# Patient Record
Sex: Male | Born: 1975 | Race: Black or African American | Hispanic: No | Marital: Single | Smoking: Current every day smoker
Health system: Southern US, Community
[De-identification: ages and names within clinical notes are randomized; demographics above are authoritative.]

## PROBLEM LIST (undated history)

## (undated) DIAGNOSIS — I2489 Other forms of acute ischemic heart disease: Secondary | ICD-10-CM

## (undated) DIAGNOSIS — I10 Essential (primary) hypertension: Secondary | ICD-10-CM

## (undated) DIAGNOSIS — N1831 Chronic kidney disease, stage 3a: Secondary | ICD-10-CM

## (undated) DIAGNOSIS — I517 Cardiomegaly: Secondary | ICD-10-CM

## (undated) DIAGNOSIS — I248 Other forms of acute ischemic heart disease: Secondary | ICD-10-CM

## (undated) DIAGNOSIS — N1832 Chronic kidney disease, stage 3b: Secondary | ICD-10-CM

## (undated) DIAGNOSIS — F1410 Cocaine abuse, uncomplicated: Secondary | ICD-10-CM

## (undated) DIAGNOSIS — R911 Solitary pulmonary nodule: Secondary | ICD-10-CM

## (undated) HISTORY — DX: Other forms of acute ischemic heart disease: I24.89

## (undated) HISTORY — DX: Cardiomegaly: I51.7

## (undated) HISTORY — DX: Other forms of acute ischemic heart disease: I24.8

## (undated) HISTORY — DX: Cocaine abuse, uncomplicated: F14.10

## (undated) HISTORY — DX: Chronic kidney disease, stage 3a: N18.31

## (undated) HISTORY — DX: Essential (primary) hypertension: I10

## (undated) HISTORY — PX: OTHER SURGICAL HISTORY: SHX169

## (undated) HISTORY — DX: Chronic kidney disease, stage 3b: N18.32

## (undated) HISTORY — DX: Solitary pulmonary nodule: R91.1

---

## 1998-06-17 ENCOUNTER — Emergency Department (HOSPITAL_COMMUNITY): Admission: EM | Admit: 1998-06-17 | Discharge: 1998-06-17 | Payer: Self-pay

## 1999-07-19 ENCOUNTER — Encounter: Payer: Self-pay | Admitting: Emergency Medicine

## 1999-07-19 ENCOUNTER — Emergency Department (HOSPITAL_COMMUNITY): Admission: EM | Admit: 1999-07-19 | Discharge: 1999-07-19 | Payer: Self-pay | Admitting: Emergency Medicine

## 2000-01-28 ENCOUNTER — Encounter: Admission: RE | Admit: 2000-01-28 | Discharge: 2000-01-28 | Payer: Self-pay | Admitting: Sports Medicine

## 2000-02-08 ENCOUNTER — Emergency Department (HOSPITAL_COMMUNITY): Admission: EM | Admit: 2000-02-08 | Discharge: 2000-02-08 | Payer: Self-pay

## 2001-05-24 ENCOUNTER — Emergency Department (HOSPITAL_COMMUNITY): Admission: EM | Admit: 2001-05-24 | Discharge: 2001-05-24 | Payer: Self-pay | Admitting: Emergency Medicine

## 2001-07-26 ENCOUNTER — Emergency Department (HOSPITAL_COMMUNITY): Admission: EM | Admit: 2001-07-26 | Discharge: 2001-07-26 | Payer: Self-pay | Admitting: Emergency Medicine

## 2002-06-11 ENCOUNTER — Emergency Department (HOSPITAL_COMMUNITY): Admission: EM | Admit: 2002-06-11 | Discharge: 2002-06-11 | Payer: Self-pay | Admitting: Emergency Medicine

## 2003-06-27 ENCOUNTER — Emergency Department (HOSPITAL_COMMUNITY): Admission: EM | Admit: 2003-06-27 | Discharge: 2003-06-27 | Payer: Self-pay | Admitting: Emergency Medicine

## 2003-06-27 ENCOUNTER — Encounter: Payer: Self-pay | Admitting: Emergency Medicine

## 2003-07-23 ENCOUNTER — Encounter: Payer: Self-pay | Admitting: *Deleted

## 2003-07-23 ENCOUNTER — Emergency Department (HOSPITAL_COMMUNITY): Admission: EM | Admit: 2003-07-23 | Discharge: 2003-07-23 | Payer: Self-pay | Admitting: Emergency Medicine

## 2004-03-21 ENCOUNTER — Emergency Department (HOSPITAL_COMMUNITY): Admission: AC | Admit: 2004-03-21 | Discharge: 2004-03-21 | Payer: Self-pay

## 2005-07-08 ENCOUNTER — Emergency Department (HOSPITAL_COMMUNITY): Admission: EM | Admit: 2005-07-08 | Discharge: 2005-07-09 | Payer: Self-pay | Admitting: Family Medicine

## 2008-07-20 ENCOUNTER — Emergency Department (HOSPITAL_COMMUNITY): Admission: EM | Admit: 2008-07-20 | Discharge: 2008-07-21 | Payer: Self-pay | Admitting: Emergency Medicine

## 2008-07-26 ENCOUNTER — Encounter: Admission: RE | Admit: 2008-07-26 | Discharge: 2008-07-26 | Payer: Self-pay | Admitting: Chiropractic Medicine

## 2008-10-30 ENCOUNTER — Inpatient Hospital Stay (HOSPITAL_COMMUNITY): Admission: EM | Admit: 2008-10-30 | Discharge: 2008-10-31 | Payer: Self-pay | Admitting: Emergency Medicine

## 2010-07-12 ENCOUNTER — Emergency Department (HOSPITAL_COMMUNITY): Admission: EM | Admit: 2010-07-12 | Discharge: 2010-07-12 | Payer: Self-pay | Admitting: Emergency Medicine

## 2010-07-19 ENCOUNTER — Emergency Department (HOSPITAL_COMMUNITY): Admission: EM | Admit: 2010-07-19 | Discharge: 2010-07-19 | Payer: Self-pay | Admitting: Emergency Medicine

## 2010-08-08 ENCOUNTER — Inpatient Hospital Stay (HOSPITAL_COMMUNITY)
Admission: EM | Admit: 2010-08-08 | Discharge: 2010-08-18 | Payer: Self-pay | Source: Home / Self Care | Admitting: Emergency Medicine

## 2010-08-09 ENCOUNTER — Ambulatory Visit: Payer: Self-pay | Admitting: Internal Medicine

## 2011-02-13 LAB — RENAL FUNCTION PANEL
Albumin: 2.6 g/dL — ABNORMAL LOW (ref 3.5–5.2)
Albumin: 2.8 g/dL — ABNORMAL LOW (ref 3.5–5.2)
Albumin: 2.8 g/dL — ABNORMAL LOW (ref 3.5–5.2)
Albumin: 2.9 g/dL — ABNORMAL LOW (ref 3.5–5.2)
BUN: 32 mg/dL — ABNORMAL HIGH (ref 6–23)
BUN: 42 mg/dL — ABNORMAL HIGH (ref 6–23)
BUN: 43 mg/dL — ABNORMAL HIGH (ref 6–23)
CO2: 23 mEq/L (ref 19–32)
CO2: 24 mEq/L (ref 19–32)
CO2: 33 mEq/L — ABNORMAL HIGH (ref 19–32)
CO2: 33 mEq/L — ABNORMAL HIGH (ref 19–32)
Calcium: 8.6 mg/dL (ref 8.4–10.5)
Calcium: 9.2 mg/dL (ref 8.4–10.5)
Calcium: 9.6 mg/dL (ref 8.4–10.5)
Calcium: 7.1 mg/dL — ABNORMAL LOW (ref 8.4–10.5)
Calcium: 8.4 mg/dL (ref 8.4–10.5)
Chloride: 105 mEq/L (ref 96–112)
Chloride: 95 mEq/L — ABNORMAL LOW (ref 96–112)
Chloride: 97 mEq/L (ref 96–112)
Chloride: 98 mEq/L (ref 96–112)
Chloride: 98 mEq/L (ref 96–112)
Creatinine, Ser: 2.94 mg/dL — ABNORMAL HIGH (ref 0.4–1.5)
Creatinine, Ser: 3.86 mg/dL — ABNORMAL HIGH (ref 0.4–1.5)
Creatinine, Ser: 7.95 mg/dL — ABNORMAL HIGH (ref 0.4–1.5)
GFR calc Af Amer: 22 mL/min — ABNORMAL LOW (ref >60)
GFR calc Af Amer: 9 mL/min — ABNORMAL LOW (ref >60)
GFR calc Af Amer: 9 mL/min — ABNORMAL LOW (ref >60)
GFR calc non Af Amer: 18 mL/min — ABNORMAL LOW (ref >60)
GFR calc non Af Amer: 8 mL/min — ABNORMAL LOW (ref >60)
GFR calc non Af Amer: 8 mL/min — ABNORMAL LOW (ref >60)
Glucose, Bld: 102 mg/dL — ABNORMAL HIGH (ref 70–99)
Glucose, Bld: 83 mg/dL (ref 70–99)
Glucose, Bld: 107 mg/dL — ABNORMAL HIGH (ref 70–99)
Glucose, Bld: 118 mg/dL — ABNORMAL HIGH (ref 70–99)
Glucose, Bld: 88 mg/dL (ref 70–99)
Glucose, Bld: 91 mg/dL (ref 70–99)
Phosphorus: 4 mg/dL (ref 2.3–4.6)
Phosphorus: 6.3 mg/dL — ABNORMAL HIGH (ref 2.3–4.6)
Phosphorus: 6.2 mg/dL — ABNORMAL HIGH (ref 2.3–4.6)
Phosphorus: 6.2 mg/dL — ABNORMAL HIGH (ref 2.3–4.6)
Phosphorus: 6.5 mg/dL — ABNORMAL HIGH (ref 2.3–4.6)
Potassium: 2.8 mEq/L — ABNORMAL LOW (ref 3.5–5.1)
Potassium: 2.8 mEq/L — ABNORMAL LOW (ref 3.5–5.1)
Potassium: 3.1 mEq/L — ABNORMAL LOW (ref 3.5–5.1)
Potassium: 3.4 mEq/L — ABNORMAL LOW (ref 3.5–5.1)
Potassium: 3.6 mEq/L (ref 3.5–5.1)
Potassium: 3.7 mEq/L (ref 3.5–5.1)
Sodium: 136 mEq/L (ref 135–145)
Sodium: 141 mEq/L (ref 135–145)
Sodium: 142 mEq/L (ref 135–145)

## 2011-02-13 LAB — TRICYCLIC ANTIDEPRESSANT EVALUATION
Amitriptyline: <5 mcg/L
Desemethylclomipramine: <5 ng/mL
Desmethyldoxepin: <5 mcg/L
Imipramine IPRAM: <5 mcg/L
Nortriptyline NTRIP: <5 mcg/L
Tot Clomipramine+Desmethylclomipramine: NOT DETECTED not reported
Total Desmethyldoxepine+Doxepin: <5 mcg/L — ABNORMAL LOW (ref 100–250)

## 2011-02-13 LAB — CBC
HCT: 38.7 % — ABNORMAL LOW (ref 39.0–52.0)
HCT: 36.5 % — ABNORMAL LOW (ref 39.0–52.0)
HCT: 47.2 % (ref 39.0–52.0)
Hemoglobin: 13.2 g/dL (ref 13.0–17.0)
Hemoglobin: 12.5 g/dL — ABNORMAL LOW (ref 13.0–17.0)
Hemoglobin: 13 g/dL (ref 13.0–17.0)
Hemoglobin: 17 g/dL (ref 13.0–17.0)
MCH: 30 pg (ref 26.0–34.0)
MCH: 31.4 pg (ref 26.0–34.0)
MCHC: 34.1 g/dL (ref 30.0–36.0)
MCHC: 34.9 g/dL (ref 30.0–36.0)
MCHC: 36 g/dL (ref 30.0–36.0)
MCV: 88 fL (ref 78.0–100.0)
MCV: 88.2 fL (ref 78.0–100.0)
RBC: 5.41 MIL/uL (ref 4.22–5.81)
RDW: 13.7 % (ref 11.5–15.5)
RDW: 13.8 % (ref 11.5–15.5)
WBC: 5.7 10*3/uL (ref 4.0–10.5)

## 2011-02-13 LAB — URINALYSIS, ROUTINE W REFLEX MICROSCOPIC
Hgb urine dipstick: NEGATIVE
Ketones, ur: 15 mg/dL — AB
Specific Gravity, Urine: 1.035 — ABNORMAL HIGH (ref 1.005–1.030)
pH: 5.5 (ref 5.0–8.0)

## 2011-02-13 LAB — COMPREHENSIVE METABOLIC PANEL
ALT: 118 U/L — ABNORMAL HIGH (ref 0–53)
AST: 487 U/L — ABNORMAL HIGH (ref 0–37)
AST: 912 U/L — ABNORMAL HIGH (ref 0–37)
Albumin: 2.8 g/dL — ABNORMAL LOW (ref 3.5–5.2)
Albumin: 3.7 g/dL (ref 3.5–5.2)
Alkaline Phosphatase: 55 U/L (ref 39–117)
Alkaline Phosphatase: 66 U/L (ref 39–117)
BUN: 33 mg/dL — ABNORMAL HIGH (ref 6–23)
Calcium: 9.2 mg/dL (ref 8.4–10.5)
Calcium: 6.9 mg/dL — ABNORMAL LOW (ref 8.4–10.5)
Chloride: 105 mEq/L (ref 96–112)
Creatinine, Ser: 4.81 mg/dL — ABNORMAL HIGH (ref 0.4–1.5)
Creatinine, Ser: 4.16 mg/dL — ABNORMAL HIGH (ref 0.4–1.5)
Creatinine, Ser: 6.37 mg/dL — ABNORMAL HIGH (ref 0.4–1.5)
GFR calc Af Amer: 17 mL/min — ABNORMAL LOW (ref >60)
GFR calc Af Amer: 12 mL/min — ABNORMAL LOW (ref >60)
GFR calc Af Amer: 20 mL/min — ABNORMAL LOW (ref >60)
Glucose, Bld: 100 mg/dL — ABNORMAL HIGH (ref 70–99)
Potassium: 3.5 mEq/L (ref 3.5–5.1)
Sodium: 143 mEq/L (ref 135–145)
Total Bilirubin: 1 mg/dL (ref 0.3–1.2)
Total Protein: 7.1 g/dL (ref 6.0–8.3)
Total Protein: 5.4 g/dL — ABNORMAL LOW (ref 6.0–8.3)

## 2011-02-13 LAB — HEPATITIS PANEL, ACUTE
Hep A IgM: NEGATIVE
Hep B C IgM: NEGATIVE
Hepatitis B Surface Ag: NEGATIVE

## 2011-02-13 LAB — BASIC METABOLIC PANEL
CO2: 18 mEq/L — ABNORMAL LOW (ref 19–32)
CO2: 31 mEq/L (ref 19–32)
Calcium: 10 mg/dL (ref 8.4–10.5)
Chloride: 107 mEq/L (ref 96–112)
Chloride: 94 mEq/L — ABNORMAL LOW (ref 96–112)
GFR calc Af Amer: 27 mL/min — ABNORMAL LOW (ref >60)
GFR calc Af Amer: 9 mL/min — ABNORMAL LOW (ref >60)
Glucose, Bld: 159 mg/dL — ABNORMAL HIGH (ref 70–99)
Potassium: 3.3 mEq/L — ABNORMAL LOW (ref 3.5–5.1)
Potassium: 4.9 mEq/L (ref 3.5–5.1)
Sodium: 136 mEq/L (ref 135–145)
Sodium: 137 mEq/L (ref 135–145)

## 2011-02-13 LAB — CK
Total CK: 2269 U/L — ABNORMAL HIGH (ref 7–232)
Total CK: 19650 U/L — ABNORMAL HIGH (ref 7–232)
Total CK: 38858 U/L — ABNORMAL HIGH (ref 7–232)
Total CK: 4817 U/L — ABNORMAL HIGH (ref 7–232)
Total CK: 48486 U/L — ABNORMAL HIGH (ref 7–232)

## 2011-02-13 LAB — RAPID URINE DRUG SCREEN, HOSP PERFORMED
Amphetamines: NOT DETECTED
Benzodiazepines: NOT DETECTED
Cocaine: POSITIVE — AB
Opiates: NOT DETECTED
Tetrahydrocannabinol: NOT DETECTED

## 2011-02-13 LAB — HIV ANTIBODY (ROUTINE TESTING W REFLEX): HIV: NONREACTIVE

## 2011-02-13 LAB — URINE CULTURE

## 2011-02-13 LAB — HEPATIC FUNCTION PANEL
ALT: 35 U/L (ref 0–53)
AST: 85 U/L — ABNORMAL HIGH (ref 0–37)
Alkaline Phosphatase: 79 U/L (ref 39–117)
Bilirubin, Direct: <0.1 mg/dL (ref 0.0–0.3)

## 2011-02-13 LAB — URINE MICROSCOPIC-ADD ON

## 2011-02-13 LAB — CARDIAC PANEL(CRET KIN+CKTOT+MB+TROPI)
CK, MB: 5.4 ng/mL — ABNORMAL HIGH (ref 0.3–4.0)
CK, MB: 278.9 ng/mL (ref 0.3–4.0)
Relative Index: 0.6 (ref 0.0–2.5)
Total CK: 339 U/L — ABNORMAL HIGH (ref 7–232)
Total CK: 44718 U/L — ABNORMAL HIGH (ref 7–232)
Troponin I: 0.09 ng/mL — ABNORMAL HIGH (ref 0.00–0.06)
Troponin I: 2.24 ng/mL (ref 0.00–0.06)

## 2011-02-13 LAB — PHOSPHORUS: Phosphorus: 5.2 mg/dL — ABNORMAL HIGH (ref 2.3–4.6)

## 2011-02-13 LAB — CREATININE, URINE, RANDOM: Creatinine, Urine: 266.4 mg/dL

## 2011-02-13 LAB — CK TOTAL AND CKMB (NOT AT ARMC): Total CK: 16414 U/L — ABNORMAL HIGH (ref 7–232)

## 2011-02-13 LAB — PROTIME-INR: Prothrombin Time: 18 seconds — ABNORMAL HIGH (ref 11.6–15.2)
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
## 2011-09-02 LAB — ETHANOL: Alcohol, Ethyl (B): 107 mg/dL — ABNORMAL HIGH (ref 0–10)

## 2011-09-02 LAB — POCT I-STAT, CHEM 8
Creatinine, Ser: 1.7 mg/dL — ABNORMAL HIGH (ref 0.4–1.5)
HCT: 41 % (ref 39.0–52.0)
Hemoglobin: 13.9 g/dL (ref 13.0–17.0)
Potassium: 4 mEq/L (ref 3.5–5.1)
Sodium: 140 mEq/L (ref 135–145)
TCO2: 21 mmol/L (ref 0–100)

## 2011-09-02 LAB — URINALYSIS, ROUTINE W REFLEX MICROSCOPIC
Bilirubin Urine: NEGATIVE
Ketones, ur: NEGATIVE mg/dL
Nitrite: NEGATIVE
Protein, ur: NEGATIVE mg/dL
Urobilinogen, UA: 0.2 mg/dL (ref 0.0–1.0)

## 2011-09-02 LAB — DIFFERENTIAL
Basophils Absolute: 0 10*3/uL (ref 0.0–0.1)
Basophils Absolute: 0 10*3/uL (ref 0.0–0.1)
Basophils Relative: 1 % (ref 0–1)
Basophils Relative: 1 % (ref 0–1)
Eosinophils Absolute: 0.2 10*3/uL (ref 0.0–0.7)
Eosinophils Absolute: 0.2 10*3/uL (ref 0.0–0.7)
Eosinophils Relative: 3 % (ref 0–5)
Eosinophils Relative: 3 % (ref 0–5)
Lymphocytes Relative: 36 % (ref 12–46)
Monocytes Absolute: 0.4 10*3/uL (ref 0.1–1.0)
Monocytes Absolute: 0.7 10*3/uL (ref 0.1–1.0)
Neutro Abs: 3.8 10*3/uL (ref 1.7–7.7)

## 2011-09-02 LAB — CBC
HCT: 40.4 % (ref 39.0–52.0)
Hemoglobin: 13.3 g/dL (ref 13.0–17.0)
Hemoglobin: 13.8 g/dL (ref 13.0–17.0)
MCHC: 34.2 g/dL (ref 30.0–36.0)
MCHC: 34.2 g/dL (ref 30.0–36.0)
MCV: 91.1 fL (ref 78.0–100.0)
Platelets: 181 10*3/uL (ref 150–400)
RDW: 13.5 % (ref 11.5–15.5)
RDW: 13.7 % (ref 11.5–15.5)

## 2011-09-02 LAB — BASIC METABOLIC PANEL
CO2: 25 mEq/L (ref 19–32)
Calcium: 8.5 mg/dL (ref 8.4–10.5)
Chloride: 112 mEq/L (ref 96–112)
Glucose, Bld: 73 mg/dL (ref 70–99)
Sodium: 141 mEq/L (ref 135–145)

## 2011-09-02 LAB — TRICYCLICS SCREEN, URINE: TCA Scrn: NOT DETECTED

## 2011-09-02 LAB — SAMPLE TO BLOOD BANK

## 2011-09-02 LAB — RAPID URINE DRUG SCREEN, HOSP PERFORMED
Cocaine: NOT DETECTED
Tetrahydrocannabinol: NOT DETECTED

## 2011-09-05 LAB — CBC
MCHC: 34.8 g/dL (ref 30.0–36.0)
Platelets: 185 10*3/uL (ref 150–400)
RDW: 13.1 % (ref 11.5–15.5)

## 2018-11-15 ENCOUNTER — Ambulatory Visit: Payer: Self-pay | Admitting: Family Medicine

## 2018-12-15 ENCOUNTER — Encounter: Payer: Self-pay | Admitting: Family Medicine

## 2018-12-15 ENCOUNTER — Ambulatory Visit: Payer: Self-pay | Admitting: Family Medicine

## 2018-12-15 ENCOUNTER — Ambulatory Visit: Payer: Self-pay | Attending: Family Medicine | Admitting: Family Medicine

## 2018-12-15 DIAGNOSIS — I10 Essential (primary) hypertension: Secondary | ICD-10-CM | POA: Insufficient documentation

## 2018-12-15 MED ORDER — HYDRALAZINE HCL 25 MG PO TABS: 25.0000 mg | ORAL_TABLET | Freq: Two times a day (BID) | ORAL | 6 refills | Status: DC

## 2018-12-15 MED ORDER — HYDROCHLOROTHIAZIDE 25 MG PO TABS: 25.0000 mg | ORAL_TABLET | Freq: Every day | ORAL | 6 refills | Status: DC

## 2018-12-15 MED ORDER — AMLODIPINE BESYLATE 10 MG PO TABS: 10.0000 mg | ORAL_TABLET | Freq: Every day | ORAL | 6 refills | Status: DC

## 2018-12-15 MED FILL — hydrALAZINE HCL 25 MG TABS: 25 | 30 days supply | Qty: 60 | Fill #0

## 2018-12-15 MED FILL — HYDROCHLOROTHIAZIDE 25 MG T: 25 | 30 days supply | Qty: 30 | Fill #0

## 2018-12-15 MED FILL — AMLODIPINE BESYLATE 10 MG T: 10 | 30 days supply | Qty: 30 | Fill #0

## 2018-12-15 NOTE — Progress Notes (Signed)
Subjective:  Patient ID: Irving Shows, male    DOB: November 26, 1976  Age: 43 y.o. MRN: [id number]  CC: New Patient (Initial Visit)   HPI ARJEN DERINGER is a 43 year old male who presents today to get established with me; prior to now he was followed by the physician where he was incarcerated. He has a medical history significant for hypertension and has been on hydralazine, amlodipine, hydrochlorothiazide which he tolerates well with no adverse effects. He is noncompliant with a diabetic diet or exercise but plans to work on this. He also takes ginger and other herbal supplements because he would like to wean himself off his antihypertensives if possible.  Past Medical History:  Diagnosis Date  . Hypertension     Past Surgical History:  Procedure Laterality Date  . no past surgery      Not on File  Family History  Problem Relation Age of Onset  . Hypertension Mother   . Hypertension Father     No outpatient medications prior to visit.   No facility-administered medications prior to visit.     ROS Review of Systems  Constitutional: Negative for activity change and appetite change.  HENT: Negative for sinus pressure and sore throat.   Eyes: Negative for visual disturbance.  Respiratory: Negative for cough, chest tightness and shortness of breath.   Cardiovascular: Negative for chest pain and leg swelling.  Gastrointestinal: Negative for abdominal distention, abdominal pain, constipation and diarrhea.  Endocrine: Negative.   Genitourinary: Negative for dysuria.  Musculoskeletal: Negative for joint swelling and myalgias.  Skin: Negative for rash.  Allergic/Immunologic: Negative.   Neurological: Negative for weakness, light-headedness and numbness.  Psychiatric/Behavioral: Negative for dysphoric mood and suicidal ideas.    Objective:  BP (!) 155/85   Pulse 76   Temp 98.1 F (36.7 C) (Oral)   Ht '5\' 4"'$  (1.626 m)   Wt 224 lb (101.6 kg)   SpO2 97%   BMI 38.45  kg/m   BP/Weight 03/09/8118  Systolic BP 147  Diastolic BP 85  Wt. (Lbs) 224  BMI 38.45     Physical Exam Constitutional:      Appearance: He is well-developed.  Cardiovascular:     Rate and Rhythm: Normal rate.     Heart sounds: Normal heart sounds. No murmur.  Pulmonary:     Effort: Pulmonary effort is normal.     Breath sounds: Normal breath sounds. No wheezing or rales.  Chest:     Chest wall: No tenderness.  Abdominal:     General: Bowel sounds are normal. There is no distension.     Palpations: Abdomen is soft. There is no mass.     Tenderness: There is no abdominal tenderness.  Musculoskeletal: Normal range of motion.  Neurological:     Mental Status: He is alert and oriented to person, place, and time.      Assessment & Plan:   1. Essential hypertension Uncontrolled No regimen change today Lifestyle modification including weight loss and will reassess at next visit  Discussed with the patient that at this time the probability of him coming off his antihypertensive is very slim but we could work towards lifestyle modifications and reassess at his next visit. - amLODipine (NORVASC) 10 MG tablet; Take 1 tablet (10 mg total) by mouth daily.  Dispense: 30 tablet; Refill: 6 - hydrochlorothiazide (HYDRODIURIL) 25 MG tablet; Take 1 tablet (25 mg total) by mouth daily.  Dispense: 30 tablet; Refill: 6 - hydrALAZINE (APRESOLINE) 25 MG tablet; Take  1 tablet (25 mg total) by mouth 2 (two) times daily.  Dispense: 60 tablet; Refill: 6 - CMP14+EGFR; Future - Lipid panel; Future   Meds ordered this encounter  Medications  . amLODipine (NORVASC) 10 MG tablet    Sig: Take 1 tablet (10 mg total) by mouth daily.    Dispense:  30 tablet    Refill:  6  . hydrochlorothiazide (HYDRODIURIL) 25 MG tablet    Sig: Take 1 tablet (25 mg total) by mouth daily.    Dispense:  30 tablet    Refill:  6  . hydrALAZINE (APRESOLINE) 25 MG tablet    Sig: Take 1 tablet (25 mg total) by mouth 2  (two) times daily.    Dispense:  60 tablet    Refill:  6    Follow-up: Return in about 3 months (around 03/16/2019) for Follow-up of chronic medical conditions.   Charlott Rakes MD

## 2018-12-22 ENCOUNTER — Other Ambulatory Visit: Payer: Self-pay

## 2018-12-29 ENCOUNTER — Telehealth: Payer: Self-pay | Admitting: Family Medicine

## 2018-12-29 ENCOUNTER — Other Ambulatory Visit: Payer: Self-pay

## 2018-12-29 NOTE — Telephone Encounter (Signed)
Pt called to request a Letter be written to social services stating that he is a patient of this facility. Please follow up

## 2018-12-29 NOTE — Telephone Encounter (Signed)
Forwarding to Dr. Alvis Lemmings who recently saw this patient

## 2018-12-31 ENCOUNTER — Other Ambulatory Visit: Payer: Self-pay

## 2018-12-31 NOTE — Telephone Encounter (Signed)
Letter is ready for pick-up.

## 2018-12-31 NOTE — Telephone Encounter (Signed)
Patient was called and informed of letter being ready for pick up. Patient's letter will be up front in log box.

## 2019-07-27 ENCOUNTER — Emergency Department (HOSPITAL_BASED_OUTPATIENT_CLINIC_OR_DEPARTMENT_OTHER)
Admission: EM | Admit: 2019-07-27 | Discharge: 2019-07-27 | Disposition: A | Discharge status: Home / Self Care | Payer: Self-pay | Attending: Emergency Medicine | Admitting: Emergency Medicine

## 2019-07-27 ENCOUNTER — Other Ambulatory Visit: Payer: Self-pay

## 2019-07-27 ENCOUNTER — Encounter (HOSPITAL_BASED_OUTPATIENT_CLINIC_OR_DEPARTMENT_OTHER): Payer: Self-pay

## 2019-07-27 DIAGNOSIS — I10 Essential (primary) hypertension: Secondary | ICD-10-CM | POA: Insufficient documentation

## 2019-07-27 DIAGNOSIS — Z91013 Allergy to seafood: Secondary | ICD-10-CM | POA: Insufficient documentation

## 2019-07-27 DIAGNOSIS — Z79899 Other long term (current) drug therapy: Secondary | ICD-10-CM | POA: Insufficient documentation

## 2019-07-27 DIAGNOSIS — Z885 Allergy status to narcotic agent status: Secondary | ICD-10-CM | POA: Insufficient documentation

## 2019-07-27 DIAGNOSIS — J020 Streptococcal pharyngitis: Secondary | ICD-10-CM

## 2019-07-27 DIAGNOSIS — J029 Acute pharyngitis, unspecified: Secondary | ICD-10-CM | POA: Insufficient documentation

## 2019-07-27 DIAGNOSIS — F17210 Nicotine dependence, cigarettes, uncomplicated: Secondary | ICD-10-CM | POA: Insufficient documentation

## 2019-07-27 LAB — GROUP A STREP BY PCR: Group A Strep by PCR: NOT DETECTED

## 2019-07-27 MED ORDER — AMOXICILLIN 500 MG PO CAPS: 1000.0000 mg | ORAL_CAPSULE | Freq: Two times a day (BID) | ORAL | 0 refills | Status: DC

## 2019-07-27 MED ORDER — AMOXICILLIN 500 MG PO CAPS
1000.0000 mg | ORAL_CAPSULE | Freq: Once | ORAL | Status: AC
  Administered 2019-07-27: 1000 mg via ORAL
  Filled 2019-07-27: qty 2

## 2019-07-27 NOTE — ED Triage Notes (Signed)
Pt c/o sore throat x today-NAD-steady gait

## 2019-07-27 NOTE — Discharge Instructions (Signed)
Take tylenol 2 pills 4 times a day and motrin 4 pills 3 times a day.  Drink plenty of fluids.  Return for worsening shortness of breath, headache, confusion. Follow up with your family doctor.   

## 2019-07-27 NOTE — ED Provider Notes (Signed)
[facility] EMERGENCY DEPARTMENT Provider Note   CSN: [id number] Arrival date & time: 07/27/19  2114     History   Chief Complaint Chief Complaint  Patient presents with  . Sore Throat    HPI Anthony Estrada is a 43 y.o. male.     43 yo M with a chief complaints of a sore throat.  Worse on the left than the right.  Patient has a history of strep throat thinks is the same.  His son also has had strep throat recently and he thinks maybe he got it from him.  No cough no fevers.  The history is provided by the patient.  Sore Throat This is a new problem. The current episode started yesterday. The problem occurs constantly. The problem has not changed since onset.Pertinent negatives include no chest pain, no abdominal pain, no headaches and no shortness of breath. The symptoms are aggravated by swallowing. Nothing relieves the symptoms. He has tried nothing for the symptoms. The treatment provided no relief.    Past Medical History:  Diagnosis Date  . Hypertension     Patient Active Problem List   Diagnosis Date Noted  . Hypertension 12/15/2018    Past Surgical History:  Procedure Laterality Date  . no past surgery          Home Medications    Prior to Admission medications   Medication Sig Start Date End Date Taking? Authorizing Provider  amLODipine (NORVASC) 10 MG tablet Take 1 tablet (10 mg total) by mouth daily. 12/15/18   Hoy RegisterNewlin, Enobong, MD  amoxicillin (AMOXIL) 500 MG capsule Take 2 capsules (1,000 mg total) by mouth 2 (two) times daily. 07/27/19   Melene PlanFloyd, Nachum Derossett, DO  hydrALAZINE (APRESOLINE) 25 MG tablet Take 1 tablet (25 mg total) by mouth 2 (two) times daily. 12/15/18   Hoy RegisterNewlin, Enobong, MD  hydrochlorothiazide (HYDRODIURIL) 25 MG tablet Take 1 tablet (25 mg total) by mouth daily. 12/15/18   Hoy RegisterNewlin, Enobong, MD    Family History Family History  Problem Relation Age of Onset  . Hypertension Mother   . Hypertension Father     Social History Social  History   Tobacco Use  . Smoking status: Current Every Day Smoker    Types: Cigarettes  . Smokeless tobacco: Never Used  Substance Use Topics  . Alcohol use: Never    Frequency: Never  . Drug use: Never     Allergies   Codeine and Shellfish allergy   Review of Systems Review of Systems  Constitutional: Negative for chills and fever.  HENT: Positive for sore throat. Negative for congestion and facial swelling.   Eyes: Negative for discharge and visual disturbance.  Respiratory: Negative for shortness of breath.   Cardiovascular: Negative for chest pain and palpitations.  Gastrointestinal: Negative for abdominal pain, diarrhea and vomiting.  Musculoskeletal: Negative for arthralgias and myalgias.  Skin: Negative for color change and rash.  Neurological: Negative for tremors, syncope and headaches.  Psychiatric/Behavioral: Negative for confusion and dysphoric mood.     Physical Exam Updated Vital Signs BP (!) 183/114 (BP Location: Left Arm)   Pulse 80   Temp 99.4 F (37.4 C) (Oral)   Resp 20   Ht 5\' 4"  (1.626 m)   Wt 110.2 kg   SpO2 99%   BMI 41.71 kg/m   Physical Exam Vitals signs and nursing note reviewed.  Constitutional:      Appearance: He is well-developed.  HENT:     Head: Normocephalic and atraumatic.  Comments: Tonsillar swelling and exudates worse on the left than the right.  Left anterior cervical lymphadenopathy.  Full range of motion of the neck tolerating secretions without difficulty.  Uvula is midline.    Mouth/Throat:     Tonsils: Tonsillar exudate present. 1+ on the right. 2+ on the left.  Eyes:     Pupils: Pupils are equal, round, and reactive to light.  Neck:     Musculoskeletal: Normal range of motion and neck supple.     Vascular: No JVD.  Cardiovascular:     Rate and Rhythm: Normal rate and regular rhythm.     Heart sounds: No murmur. No friction rub. No gallop.   Pulmonary:     Effort: No respiratory distress.     Breath sounds:  No wheezing.  Abdominal:     General: There is no distension.     Tenderness: There is no guarding or rebound.  Musculoskeletal: Normal range of motion.  Skin:    Coloration: Skin is not pale.     Findings: No rash.  Neurological:     Mental Status: He is alert and oriented to person, place, and time.  Psychiatric:        Behavior: Behavior normal.      ED Treatments / Results  Labs (all labs ordered are listed, but only abnormal results are displayed) Labs Reviewed  GROUP A STREP BY PCR    EKG None  Radiology No results found.  Procedures Procedures (including critical care time)  Medications Ordered in ED Medications  amoxicillin (AMOXIL) capsule 1,000 mg (1,000 mg Oral Given 07/27/19 2256)     Initial Impression / Assessment and Plan / ED Course  I have reviewed the triage vital signs and the nursing notes.  Pertinent labs & imaging results that were available during my care of the patient were reviewed by me and considered in my medical decision making (see chart for details).        43 yo M with a chief complaint of a sore throat.  Clinically the patient has strep pharyngitis.  Was recently exposed to strep as well.  Rapid strep test sent.  The strep test is negative.  Patient does have high score on centor criteria.  Will treat presumptively.  PCP follow-up.  11:33 PM:  I have discussed the diagnosis/risks/treatment options with the patient and family and believe the pt to be eligible for discharge home to follow-up with PCP. We also discussed returning to the ED immediately if new or worsening sx occur. We discussed the sx which are most concerning (e.g., sudden worsening pain, fever, inability to tolerate by mouth) that necessitate immediate return. Medications administered to the patient during their visit and any new prescriptions provided to the patient are listed below.  Medications given during this visit Medications  amoxicillin (AMOXIL) capsule 1,000  mg (1,000 mg Oral Given 07/27/19 2256)     The patient appears reasonably screen and/or stabilized for discharge and I doubt any other medical condition or other [facility] requiring further screening, evaluation, or treatment in the ED at this time prior to discharge.    Final Clinical Impressions(s) / ED Diagnoses   Final diagnoses:  Strep pharyngitis    ED Discharge Orders         Ordered    amoxicillin (AMOXIL) 500 MG capsule  2 times daily     07/27/19 2246           Deno Etienne, DO 07/27/19 2333

## 2019-07-28 MED FILL — AMOXICILLIN 500 MG CAPSULE: 500 | 10 days supply | Qty: 40 | Fill #0

## 2019-11-21 ENCOUNTER — Emergency Department (HOSPITAL_BASED_OUTPATIENT_CLINIC_OR_DEPARTMENT_OTHER): Payer: Self-pay

## 2019-11-21 ENCOUNTER — Observation Stay (HOSPITAL_BASED_OUTPATIENT_CLINIC_OR_DEPARTMENT_OTHER)
Admission: EM | Admit: 2019-11-21 | Discharge: 2019-11-21 | Discharge status: Against Medical Advice | Payer: Self-pay | Attending: Internal Medicine | Admitting: Internal Medicine

## 2019-11-21 ENCOUNTER — Other Ambulatory Visit: Payer: Self-pay

## 2019-11-21 ENCOUNTER — Encounter (HOSPITAL_BASED_OUTPATIENT_CLINIC_OR_DEPARTMENT_OTHER): Payer: Self-pay | Admitting: *Deleted

## 2019-11-21 DIAGNOSIS — R7989 Other specified abnormal findings of blood chemistry: Secondary | ICD-10-CM

## 2019-11-21 DIAGNOSIS — Z8249 Family history of ischemic heart disease and other diseases of the circulatory system: Secondary | ICD-10-CM | POA: Insufficient documentation

## 2019-11-21 DIAGNOSIS — F17210 Nicotine dependence, cigarettes, uncomplicated: Secondary | ICD-10-CM | POA: Insufficient documentation

## 2019-11-21 DIAGNOSIS — I214 Non-ST elevation (NSTEMI) myocardial infarction: Secondary | ICD-10-CM | POA: Diagnosis present

## 2019-11-21 DIAGNOSIS — Z91013 Allergy to seafood: Secondary | ICD-10-CM | POA: Insufficient documentation

## 2019-11-21 DIAGNOSIS — I10 Essential (primary) hypertension: Secondary | ICD-10-CM

## 2019-11-21 DIAGNOSIS — Z885 Allergy status to narcotic agent status: Secondary | ICD-10-CM | POA: Insufficient documentation

## 2019-11-21 DIAGNOSIS — R778 Other specified abnormalities of plasma proteins: Secondary | ICD-10-CM

## 2019-11-21 DIAGNOSIS — Z20828 Contact with and (suspected) exposure to other viral communicable diseases: Secondary | ICD-10-CM | POA: Insufficient documentation

## 2019-11-21 DIAGNOSIS — R079 Chest pain, unspecified: Principal | ICD-10-CM

## 2019-11-21 LAB — MAGNESIUM: Magnesium: 2.2 mg/dL (ref 1.7–2.4)

## 2019-11-21 LAB — TROPONIN I (HIGH SENSITIVITY)
Troponin I (High Sensitivity): 63 ng/L — ABNORMAL HIGH (ref <18)
Troponin I (High Sensitivity): 69 ng/L — ABNORMAL HIGH (ref <18)

## 2019-11-21 LAB — CBC
HCT: 39.6 % (ref 39.0–52.0)
Hemoglobin: 13.4 g/dL (ref 13.0–17.0)
MCH: 30.1 pg (ref 26.0–34.0)
MCHC: 33.8 g/dL (ref 30.0–36.0)
MCV: 89 fL (ref 80.0–100.0)
Platelets: 212 10*3/uL (ref 150–400)
RBC: 4.45 MIL/uL (ref 4.22–5.81)
RDW: 13.2 % (ref 11.5–15.5)
WBC: 5.5 10*3/uL (ref 4.0–10.5)
nRBC: 0 % (ref 0.0–0.2)

## 2019-11-21 LAB — BASIC METABOLIC PANEL
Anion gap: 8 (ref 5–15)
BUN: 19 mg/dL (ref 6–20)
CO2: 24 mmol/L (ref 22–32)
Calcium: 9.1 mg/dL (ref 8.9–10.3)
Chloride: 108 mmol/L (ref 98–111)
Creatinine, Ser: 2.05 mg/dL — ABNORMAL HIGH (ref 0.61–1.24)
GFR calc Af Amer: 45 mL/min — ABNORMAL LOW (ref >60)
GFR calc non Af Amer: 39 mL/min — ABNORMAL LOW (ref >60)
Glucose, Bld: 104 mg/dL — ABNORMAL HIGH (ref 70–99)
Potassium: 3.9 mmol/L (ref 3.5–5.1)
Sodium: 140 mmol/L (ref 135–145)

## 2019-11-21 LAB — SARS CORONAVIRUS 2 AG (30 MIN TAT): SARS Coronavirus 2 Ag: NEGATIVE

## 2019-11-21 LAB — D-DIMER, QUANTITATIVE: D-Dimer, Quant: 0.66 ug/mL-FEU — ABNORMAL HIGH (ref 0.00–0.50)

## 2019-11-21 MED ORDER — AMLODIPINE BESYLATE 10 MG PO TABS: 10.0000 mg | ORAL_TABLET | Freq: Every day | ORAL | 0 refills | Status: DC

## 2019-11-21 MED ORDER — SODIUM CHLORIDE 0.9% FLUSH
3.0000 mL | Freq: Once | INTRAVENOUS | Status: DC
  Filled 2019-11-21: qty 3

## 2019-11-21 MED ORDER — FENTANYL CITRATE (PF) 100 MCG/2ML IJ SOLN
50.0000 ug | Freq: Once | INTRAMUSCULAR | Status: DC
  Filled 2019-11-21: qty 2

## 2019-11-21 MED ORDER — HYDRALAZINE HCL 25 MG PO TABS
25.0000 mg | ORAL_TABLET | Freq: Once | ORAL | Status: AC
  Administered 2019-11-21: 20:00:00 25 mg via ORAL
  Filled 2019-11-21: qty 1

## 2019-11-21 MED ORDER — OXYCODONE-ACETAMINOPHEN 5-325 MG PO TABS
1.0000 | ORAL_TABLET | Freq: Once | ORAL | Status: AC
  Administered 2019-11-21: 1 via ORAL
  Filled 2019-11-21: qty 1

## 2019-11-21 MED ORDER — ASPIRIN 81 MG PO CHEW
324.0000 mg | CHEWABLE_TABLET | Freq: Once | ORAL | Status: AC
  Administered 2019-11-21: 20:00:00 324 mg via ORAL
  Filled 2019-11-21: qty 4

## 2019-11-21 MED ORDER — METOPROLOL TARTRATE 5 MG/5ML IV SOLN
5.0000 mg | Freq: Once | INTRAVENOUS | Status: DC
  Filled 2019-11-21: qty 5

## 2019-11-21 MED ORDER — AMLODIPINE BESYLATE 5 MG PO TABS
10.0000 mg | ORAL_TABLET | Freq: Once | ORAL | Status: AC
  Administered 2019-11-21: 10 mg via ORAL
  Filled 2019-11-21: qty 2

## 2019-11-21 NOTE — Progress Notes (Signed)
TRH transfer acceptance note:   Per Kennith Maes PAC  . Shortness of Breath  . Chest Pain   Anthony Estrada is a 43 y.o. male with a history of tobacco abuse & hypertension who presents to the ED with complaints of chest pain x 4 days. Patient states pain is located to the left lower chest, it is a pressure sensation, constant, worse with deep breathing and certain positions, no alleviating factors. Reports associated dyspnea. He has been coughing clear sputum for about 1 week, he was not coughing with onset of pain, was at rest. Denies nausea, vomiting, diaphoresis, syncope,  leg pain/swelling, hemoptysis, recent surgery/trauma, recent long travel, hormone use, personal hx of cancer, or hx of DVT/PE.   Patient has history of hypertension, has been off of his antihypertensive medications for about 1 year, did this without consulting his PCP. He was concerned that one of them may lead to cancer.    [facility] ROUTINE RECORD EKG Vent. rate 87 BPM PR interval 142 ms QRS duration 94 ms QT/QTc 400/481 ms P-R-T axes 59 11 149 21-Nov-2019 17:14:08  Normal sinus rhythm Biatrial enlargement Minimal voltage criteria for LVH, may be normal variant ( Cornell product ) ST & T wave abnormality, consider lateral ischemia Prolonged QT Abnormal ECG  Troponin I (High Sensitivity) [[id number]] (Abnormal)   Collected: 11/21/19 1952   Updated: 11/21/19 2025   Specimen Source: Vein    Troponin I (High Sensitivity) 69High  ng/L  SARS Coronavirus 2 Ag (30 min TAT) - Nasal Swab (BD Veritor Kit) [[id number]]   Collected: 11/21/19 1952   Updated: 11/21/19 2016   Specimen Source: Nasal Swab (BD Veritor Kit)    SARS Coronavirus 2 Ag NEGATIVE  Troponin I (High Sensitivity) [[id number]] (Abnormal)   Collected: 11/21/19 1747   Updated: 11/21/19 1907   Specimen Source: Vein    Troponin I (High Sensitivity) 63High  ng/L  Basic metabolic panel [[id number]] (Abnormal)   Collected:  11/21/19 1747   Updated: 11/21/19 1838   Specimen Type: Blood   Specimen Source: Vein    Sodium 140 mmol/L   Potassium 3.9 mmol/L   Chloride 108 mmol/L   CO2 24 mmol/L   Glucose, Bld 104High  mg/dL   BUN 19 mg/dL   Creatinine, Ser 2.05High  mg/dL   Calcium 9.1 mg/dL   GFR calc non Af Amer 39Low  mL/min   GFR calc Af Amer 45Low  mL/min   Anion gap 8  D-dimer, quantitative (not at [facility]) [[id number]] (Abnormal)   Collected: 11/21/19 1747   Updated: 11/21/19 1835   Specimen Type: Blood   Specimen Source: Vein    D-Dimer, Quant 0.66High  ug/mL-FEU  CBC [[id number]]   Collected: 11/21/19 1747   Updated: 11/21/19 1820   Specimen Type: Blood   Specimen Source: Vein    WBC 5.5 K/uL   RBC 4.45 MIL/uL   Hemoglobin 13.4 g/dL   HCT 39.6 %   MCV 89.0 fL   MCH 30.1 pg   MCHC 33.8 g/dL   RDW 13.2 %   Platelets 212 K/uL   nRBC 0.0 %    Accepted to [facility]/OBS/TELEMETRY. Will need echo, maybe VQ scan. Consider cardiology evaluation. Metoprolol 5 mg IVP x1 dose ordered. Check magnesium level.  Prolonged QT, but has CKD and had hypermagnesemia before.  Tennis Must, MD

## 2019-11-21 NOTE — ED Triage Notes (Signed)
Pt c/o SOB, left sided chest pain  X 4 days

## 2019-11-21 NOTE — ED Provider Notes (Signed)
[facility] EMERGENCY DEPARTMENT Provider Note   CSN: [id number] Arrival date & time: 11/21/19  1655     History Chief Complaint  Patient presents with  . Shortness of Breath  . Chest Pain   Anthony Estrada is a 43 y.o. male with a history of tobacco abuse & hypertension who presents to the ED with complaints of chest pain x 4 days. Patient states pain is located to the left lower chest, it is a pressure sensation, constant, worse with deep breathing and certain positions, no alleviating factors. Reports associated dyspnea. He has been coughing clear sputum for about 1 week, he was not coughing with onset of pain, was at rest. Denies nausea, vomiting, diaphoresis, syncope,  leg pain/swelling, hemoptysis, recent surgery/trauma, recent long travel, hormone use, personal hx of cancer, or hx of DVT/PE.   Patient has history of hypertension, has been off of his antihypertensive medications for about 1 year, did this without consulting his PCP. He was concerned that one of them may lead to cancer.      HPI     Past Medical History:  Diagnosis Date  . Hypertension     Patient Active Problem List   Diagnosis Date Noted  . Hypertension 12/15/2018    Past Surgical History:  Procedure Laterality Date  . no past surgery         Family History  Problem Relation Age of Onset  . Hypertension Mother   . Hypertension Father     Social History   Tobacco Use  . Smoking status: Current Every Day Smoker    Packs/day: 1.00    Types: Cigarettes  . Smokeless tobacco: Never Used  Substance Use Topics  . Alcohol use: Never  . Drug use: Never    Home Medications Prior to Admission medications   Medication Sig Start Date End Date Taking? Authorizing Provider  amLODipine (NORVASC) 10 MG tablet Take 1 tablet (10 mg total) by mouth daily. 12/15/18   Hoy RegisterNewlin, Enobong, MD  amoxicillin (AMOXIL) 500 MG capsule Take 2 capsules (1,000 mg total) by mouth 2 (two) times daily.  07/27/19   Melene PlanFloyd, Dan, DO  hydrALAZINE (APRESOLINE) 25 MG tablet Take 1 tablet (25 mg total) by mouth 2 (two) times daily. 12/15/18   Hoy RegisterNewlin, Enobong, MD  hydrochlorothiazide (HYDRODIURIL) 25 MG tablet Take 1 tablet (25 mg total) by mouth daily. 12/15/18   Hoy RegisterNewlin, Enobong, MD    Allergies    Codeine and Shellfish allergy  Review of Systems   Review of Systems  Constitutional: Negative for chills and fever.  Respiratory: Positive for cough and shortness of breath.   Cardiovascular: Positive for chest pain. Negative for palpitations and leg swelling.  Gastrointestinal: Negative for abdominal pain, diarrhea, nausea and vomiting.  Neurological: Negative for syncope, weakness and numbness.  All other systems reviewed and are negative.   Physical Exam Updated Vital Signs BP (!) 174/102 (BP Location: Left Arm)   Pulse 89   Temp 99.4 F (37.4 C) (Oral)   Resp 20   Ht 5\' 4"  (1.626 m)   Wt 99.8 kg   SpO2 100%   BMI 37.76 kg/m   Physical Exam Vitals and nursing note reviewed.  Constitutional:      General: He is not in acute distress.    Appearance: He is well-developed. He is not toxic-appearing.  HENT:     Head: Normocephalic and atraumatic.     Right Ear: Ear canal normal. Tympanic membrane is not perforated, erythematous, retracted or  bulging.     Left Ear: Ear canal normal. Tympanic membrane is not perforated, erythematous, retracted or bulging.     Ears:     Comments: No mastoid erythema/swellng/tenderness.     Nose:     Right Sinus: No maxillary sinus tenderness or frontal sinus tenderness.     Left Sinus: No maxillary sinus tenderness or frontal sinus tenderness.     Mouth/Throat:     Pharynx: Oropharynx is clear. Uvula midline. No oropharyngeal exudate or posterior oropharyngeal erythema.     Comments: Posterior oropharynx is symmetric appearing. Patient tolerating own secretions without difficulty. No trismus. No drooling. No hot potato voice. No swelling beneath the  tongue, submandibular compartment is soft.  Eyes:     General:        Right eye: No discharge.        Left eye: No discharge.     Conjunctiva/sclera: Conjunctivae normal.  Cardiovascular:     Rate and Rhythm: Normal rate and regular rhythm.  Pulmonary:     Effort: Pulmonary effort is normal. No respiratory distress.     Breath sounds: Normal breath sounds. No wheezing, rhonchi or rales.  Chest:     Chest wall: Tenderness (left lower anterior chest wall, does not necessarily reproduce his symptoms. ) present.  Abdominal:     General: There is no distension.     Palpations: Abdomen is soft.     Tenderness: There is no abdominal tenderness.  Musculoskeletal:     Cervical back: Neck supple. No rigidity.     Right lower leg: No tenderness. No edema.     Left lower leg: No tenderness. No edema.  Lymphadenopathy:     Cervical: No cervical adenopathy.  Skin:    General: Skin is warm and dry.     Findings: No rash.  Neurological:     Mental Status: He is alert.  Psychiatric:        Behavior: Behavior normal.    ED Results / Procedures / Treatments   Labs (all labs ordered are listed, but only abnormal results are displayed) Labs Reviewed  BASIC METABOLIC PANEL - Abnormal; Notable for the following components:      Result Value   Glucose, Bld 104 (*)    Creatinine, Ser 2.05 (*)    GFR calc non Af Amer 39 (*)    GFR calc Af Amer 45 (*)    All other components within normal limits  D-DIMER, QUANTITATIVE (NOT AT [facility]) - Abnormal; Notable for the following components:   D-Dimer, Quant 0.66 (*)    All other components within normal limits  CBC  TROPONIN I (HIGH SENSITIVITY)    EKG EKG Interpretation  Date/Time:  Monday November 21 2019 17:14:08 EST Ventricular Rate:  87 PR Interval:  142 QRS Duration: 94 QT Interval:  400 QTC Calculation: 481 R Axis:   11 Text Interpretation: Normal sinus rhythm Biatrial enlargement Minimal voltage criteria for LVH, may be normal variant  ( Cornell product ) ST & T wave abnormality, consider lateral ischemia Prolonged QT Abnormal ECG Confirmed by Vanetta Mulders [phone]) on 11/21/2019 5:19:32 PM   Radiology DG Chest 2 View  Result Date: 11/21/2019 CLINICAL DATA:  Left-sided chest pain.  Shortness of breath. EXAM: CHEST - 2 VIEW COMPARISON:  08/11/2010. FINDINGS: Mediastinum hilar structures normal. Lungs are clear. No pleural effusion or pneumothorax. Heart size normal. No acute bony abnormality identified. IMPRESSION: No acute cardiopulmonary disease. Electronically Signed   By: Maisie Fus  Register   On:  11/21/2019 17:38    Procedures Procedures (including critical care time)  Medications Ordered in ED Medications  metoprolol tartrate (LOPRESSOR) injection 5 mg (5 mg Intravenous Refused 11/21/19 2141)  oxyCODONE-acetaminophen (PERCOCET/ROXICET) 5-325 MG per tablet 1 tablet (1 tablet Oral Given 11/21/19 1822)  aspirin chewable tablet 324 mg (324 mg Oral Given 11/21/19 1947)  amLODipine (NORVASC) tablet 10 mg (10 mg Oral Given 11/21/19 1948)  hydrALAZINE (APRESOLINE) tablet 25 mg (25 mg Oral Given 11/21/19 1948)    ED Course  I have reviewed the triage vital signs and the nursing notes.  Pertinent labs & imaging results that were available during my care of the patient were reviewed by me and considered in my medical decision making (see chart for details).    MDM Rules/Calculators/A&P                      Patient with a history of hypertension not currently taking his antihypertensive medicines who presents to the ED with complaints of chest pain with dyspnea for the past few days, cough x  1 week. On arrival patient's Bps is elevated 174/102, vitals otherwise WNL. Exam with some left chest wall tenderness but this does not necessarily reproduce his chest pain. Lungs CTA. No peripheral edema.   CBC: No anemia/leukocytosis.  BMP: Elevated creatinine  @ 2.05 which is improved from prior. No significant electrolyte  derangement.  Ddimer: Mildly elevated @ 0.66 Troponin: Elevated some @ 63.  EKG: Normal sinus rhythm Biatrial enlargement Minimal voltage criteria for LVH, may be normal variant ( Cornell product ) ST & T wave abnormality, consider lateral ischemia Prolonged QT Abnormal ECG CXR: No acute cardiopulmonary disease.  Patient's blood pressure is up trending with BP 204/130-we will order his previously prescribed home medication of hydralazine and amlodipine.  His D-dimer is elevated, unable to perform CTA secondary to poor renal function to rule out PE his repeat troponin is mildly uptrending at 69.  Will consult hospitalist service for admission for chest pain observation as well as VQ scan to further assess for PE.  Discussed findings and plan of care with patient who is agreeable.  20:45: CONSULT: Discussed with hospitalist Dr. Olevia Bowens, accepts admission to [facility].   Shortly after my conversation with the hospital service, patient change his mind and states that he wants to leave, he does not want to sit in the emergency department.  He is requesting to leave.   The patient and I discussed the nature and purpose, risks and benefits, as well as, the alternatives. Time was given to allow the opportunity to ask questions and consider options. After the discussion, the patient refused admission.  The patient was informed that refusal could lead to, but was not limited to, death, permanent disability, or severe pain. Prior to refusing, I determined that the patient had the capacity to make their decision and understood the consequences of that decision. After refusal, I made every reasonable effort to treat them to the best of my ability.  The patient was notified that they may return to the emergency department at any time for further treatment.    Patient signed out [place].  Provided prescription for 30-day supply of amlodipine as he was taking this previously and his blood pressure is  elevated in the ER today.  Findings and plan of care discussed with supervising physician Dr. Rogene Houston who is in agreement.   Final Clinical Impression(s) / ED Diagnoses Final diagnoses:  Chest pain, unspecified  type  Elevated serum creatinine  Positive D-dimer  Elevated troponin    Rx / DC Orders ED Discharge Orders         Ordered    amLODipine (NORVASC) 10 MG tablet  Daily     11/21/19 2152           Cherly Anderson, PA-C 11/21/19 2157    Vanetta Mulders, MD 11/24/19 [phone]

## 2019-11-21 NOTE — Discharge Instructions (Addendum)
You were seen in the emergency department today for chest pain.  Your labs including your heart enzymes and a lab we check for blood clots as well as your EKG were somewhat abnormal.  We have recommended you be admitted to the hospital for further monitoring and for a VQ scan to check for a blood clot.  You are leaving the emergency department [place].  You may return to the emergency department anytime.  Return immediately for new or worsening symptoms or any other concerns.  Please follow-up with your primary care provider for same tomorrow morning.  We are sending you home with a prescription for amlodipine, this is a blood pressure medicine you were previously taking, is to help treat your high blood pressure, please have your blood pressure rechecked at your primary care provider as it was very elevated in the ER today and also to discuss further blood pressure management.

## 2019-11-22 ENCOUNTER — Other Ambulatory Visit: Payer: Self-pay

## 2019-11-22 ENCOUNTER — Emergency Department (HOSPITAL_COMMUNITY): Payer: Self-pay

## 2019-11-22 ENCOUNTER — Emergency Department (HOSPITAL_COMMUNITY)
Admission: EM | Admit: 2019-11-22 | Discharge: 2019-11-22 | Disposition: A | Discharge status: Home / Self Care | Payer: Self-pay | Attending: Emergency Medicine | Admitting: Emergency Medicine

## 2019-11-22 ENCOUNTER — Encounter (HOSPITAL_COMMUNITY): Payer: Self-pay | Admitting: Emergency Medicine

## 2019-11-22 DIAGNOSIS — R0789 Other chest pain: Secondary | ICD-10-CM | POA: Insufficient documentation

## 2019-11-22 DIAGNOSIS — F17210 Nicotine dependence, cigarettes, uncomplicated: Secondary | ICD-10-CM | POA: Insufficient documentation

## 2019-11-22 DIAGNOSIS — R911 Solitary pulmonary nodule: Secondary | ICD-10-CM | POA: Insufficient documentation

## 2019-11-22 DIAGNOSIS — I10 Essential (primary) hypertension: Secondary | ICD-10-CM | POA: Insufficient documentation

## 2019-11-22 DIAGNOSIS — Z79899 Other long term (current) drug therapy: Secondary | ICD-10-CM | POA: Insufficient documentation

## 2019-11-22 LAB — BASIC METABOLIC PANEL
Anion gap: 10 (ref 5–15)
BUN: 18 mg/dL (ref 6–20)
CO2: 25 mmol/L (ref 22–32)
Calcium: 8.9 mg/dL (ref 8.9–10.3)
Chloride: 106 mmol/L (ref 98–111)
Creatinine, Ser: 1.81 mg/dL — ABNORMAL HIGH (ref 0.61–1.24)
GFR calc Af Amer: 52 mL/min — ABNORMAL LOW (ref >60)
GFR calc non Af Amer: 45 mL/min — ABNORMAL LOW (ref >60)
Glucose, Bld: 111 mg/dL — ABNORMAL HIGH (ref 70–99)
Potassium: 3.8 mmol/L (ref 3.5–5.1)
Sodium: 141 mmol/L (ref 135–145)

## 2019-11-22 LAB — CBC
HCT: 44.1 % (ref 39.0–52.0)
Hemoglobin: 14.5 g/dL (ref 13.0–17.0)
MCH: 30.1 pg (ref 26.0–34.0)
MCHC: 32.9 g/dL (ref 30.0–36.0)
MCV: 91.7 fL (ref 80.0–100.0)
Platelets: 210 10*3/uL (ref 150–400)
RBC: 4.81 MIL/uL (ref 4.22–5.81)
RDW: 13.3 % (ref 11.5–15.5)
WBC: 4.4 10*3/uL (ref 4.0–10.5)
nRBC: 0 % (ref 0.0–0.2)

## 2019-11-22 LAB — SARS CORONAVIRUS 2 (TAT 6-24 HRS): SARS Coronavirus 2: NEGATIVE

## 2019-11-22 LAB — TROPONIN I (HIGH SENSITIVITY)
Troponin I (High Sensitivity): 59 ng/L — ABNORMAL HIGH (ref <18)
Troponin I (High Sensitivity): 56 ng/L — ABNORMAL HIGH (ref <18)

## 2019-11-22 MED ORDER — IOHEXOL 350 MG/ML SOLN
100.0000 mL | Freq: Once | INTRAVENOUS | Status: AC | PRN
  Administered 2019-11-22: 100 mL via INTRAVENOUS

## 2019-11-22 MED ORDER — SODIUM CHLORIDE (PF) 0.9 % IJ SOLN
INTRAMUSCULAR | Status: AC
  Filled 2019-11-22: qty 50

## 2019-11-22 MED ORDER — SODIUM CHLORIDE 0.9 % IV BOLUS
500.0000 mL | Freq: Once | INTRAVENOUS | Status: AC
  Administered 2019-11-22: 500 mL via INTRAVENOUS

## 2019-11-22 MED ORDER — HYDROCHLOROTHIAZIDE 25 MG PO TABS: 25.0000 mg | ORAL_TABLET | Freq: Every day | ORAL | 1 refills | Status: DC

## 2019-11-22 MED ORDER — HYDRALAZINE HCL 25 MG PO TABS: 25.0000 mg | ORAL_TABLET | Freq: Two times a day (BID) | ORAL | 1 refills | Status: DC

## 2019-11-22 MED ORDER — LABETALOL HCL 5 MG/ML IV SOLN
20.0000 mg | Freq: Once | INTRAVENOUS | Status: AC
  Administered 2019-11-22: 20 mg via INTRAVENOUS
  Filled 2019-11-22: qty 4

## 2019-11-22 MED ORDER — ASPIRIN EC 325 MG PO TBEC: 325.0000 mg | DELAYED_RELEASE_TABLET | Freq: Every day | ORAL | 1 refills | Status: DC

## 2019-11-22 NOTE — Discharge Instructions (Addendum)
Start taking blood pressure medications again. Make an appointment to follow-up with your PCP. You do not have a blood clot in your lungs. It did show a lung nodule though that will need followed up. A copy of your CT report is included with your papers. You will either need a repeat CT scan in 3 months, a PET scan or a biopsy. This is something you need to discuss with your family doctor.This is probably not the cause of your pain. I think the next step is to obtain a cardiac stress test.

## 2019-11-22 NOTE — ED Provider Notes (Signed)
[facility]-EMERGENCY DEPT Provider Note   CSN: [id number] Arrival date & time: 11/22/19  1119     History Chief Complaint  Patient presents with  . Chest Pain    Anthony Estrada is a 43 y.o. male.  HPI   43 year old male with chest pain.  Symptom onset about 5 days ago.  He was actually seen in the emergency room yesterday at [facility].  He was admitted but got tired of waiting for transportation and subsequently left AGAINST MEDICAL ADVICE.  He returned today because he continues of the same symptoms.  He describes a pressure sensation in his left chest.  Sometimes worse with deep breathing.  Occasional cough.  Mild dyspnea.  No fevers.  No change with exertion.  Does not clearly notice a change whether upright or supine.  No unusual leg pain or swelling.  Very hypertensive again today.  He reports he was previously on hydrochlorothiazide, hydralazine and amlodipine for his blood pressure and additionally took aspirin.  Past Medical History:  Diagnosis Date  . Hypertension     Patient Active Problem List   Diagnosis Date Noted  . Chest pain 11/21/2019  . Hypertension 12/15/2018    Past Surgical History:  Procedure Laterality Date  . no past surgery         Family History  Problem Relation Age of Onset  . Hypertension Mother   . Hypertension Father     Social History   Tobacco Use  . Smoking status: Current Every Day Smoker    Packs/day: 1.00    Types: Cigarettes  . Smokeless tobacco: Never Used  Substance Use Topics  . Alcohol use: Never  . Drug use: Never    Home Medications Prior to Admission medications   Medication Sig Start Date End Date Taking? Authorizing Provider  amLODipine (NORVASC) 10 MG tablet Take 1 tablet (10 mg total) by mouth daily. 11/21/19   Petrucelli, Samantha R, PA-C  amoxicillin (AMOXIL) 500 MG capsule Take 2 capsules (1,000 mg total) by mouth 2 (two) times daily. 07/27/19   Melene Plan, DO    hydrALAZINE (APRESOLINE) 25 MG tablet Take 1 tablet (25 mg total) by mouth 2 (two) times daily. 12/15/18   Hoy Register, MD  hydrochlorothiazide (HYDRODIURIL) 25 MG tablet Take 1 tablet (25 mg total) by mouth daily. 12/15/18   Hoy Register, MD    Allergies    Codeine and Shellfish allergy  Review of Systems   Review of Systems All systems reviewed and negative, other than as noted in HPI.  Physical Exam Updated Vital Signs BP (!) 197/149 (BP Location: Left Arm)   Pulse 96   Temp 98.6 F (37 C) (Oral)   Resp 18   SpO2 100%   Physical Exam Vitals and nursing note reviewed.  Constitutional:      General: He is not in acute distress.    Appearance: He is well-developed.  HENT:     Head: Normocephalic and atraumatic.  Eyes:     General:        Right eye: No discharge.        Left eye: No discharge.     Conjunctiva/sclera: Conjunctivae normal.  Cardiovascular:     Rate and Rhythm: Normal rate and regular rhythm.     Heart sounds: Normal heart sounds. No murmur. No friction rub. No gallop.   Pulmonary:     Effort: Pulmonary effort is normal. No respiratory distress.     Breath sounds: Normal  breath sounds.  Abdominal:     General: There is no distension.     Palpations: Abdomen is soft.     Tenderness: There is no abdominal tenderness.  Musculoskeletal:        General: No tenderness.     Cervical back: Neck supple.  Skin:    General: Skin is warm and dry.  Neurological:     Mental Status: He is alert.  Psychiatric:        Behavior: Behavior normal.        Thought Content: Thought content normal.     ED Results / Procedures / Treatments   Labs (all labs ordered are listed, but only abnormal results are displayed) Labs Reviewed  BASIC METABOLIC PANEL - Abnormal; Notable for the following components:      Result Value   Glucose, Bld 111 (*)    Creatinine, Ser 1.81 (*)    GFR calc non Af Amer 45 (*)    GFR calc Af Amer 52 (*)    All other components within  normal limits  TROPONIN I (HIGH SENSITIVITY) - Abnormal; Notable for the following components:   Troponin I (High Sensitivity) 59 (*)    All other components within normal limits  TROPONIN I (HIGH SENSITIVITY) - Abnormal; Notable for the following components:   Troponin I (High Sensitivity) 56 (*)    All other components within normal limits  CBC    EKG EKG Interpretation  Date/Time:  Tuesday November 22 2019 11:37:29 EST Ventricular Rate:  91 PR Interval:    QRS Duration: 93 QT Interval:  390 QTC Calculation: 480 R Axis:   47 Text Interpretation: Sinus rhythm Biatrial enlargement Probable LVH with secondary repol abnrm Borderline prolonged QT interval Confirmed by Virgel Manifold [phone]) on 11/22/2019 4:47:43 PM   Radiology DG Chest 2 View  Result Date: 11/22/2019 CLINICAL DATA:  Chest pain EXAM: CHEST - 2 VIEW COMPARISON:  11/21/2019 FINDINGS: The heart size and mediastinal contours are within normal limits. Both lungs are clear. No pleural effusion or pneumothorax. The visualized skeletal structures are unremarkable. IMPRESSION: No acute process in the chest. Electronically Signed   By: Macy Mis M.D.   On: 11/22/2019 12:07   DG Chest 2 View  Result Date: 11/21/2019 CLINICAL DATA:  Left-sided chest pain.  Shortness of breath. EXAM: CHEST - 2 VIEW COMPARISON:  08/11/2010. FINDINGS: Mediastinum hilar structures normal. Lungs are clear. No pleural effusion or pneumothorax. Heart size normal. No acute bony abnormality identified. IMPRESSION: No acute cardiopulmonary disease. Electronically Signed   By: Marcello Moores  Register   On: 11/21/2019 17:38    Procedures Procedures (including critical care time)  Medications Ordered in ED Medications  sodium chloride 0.9 % bolus 500 mL (has no administration in time range)  labetalol (NORMODYNE) injection 20 mg (20 mg Intravenous Given 11/22/19 1705)    ED Course  I have reviewed the triage vital signs and the nursing  notes.  Pertinent labs & imaging results that were available during my care of the patient were reviewed by me and considered in my medical decision making (see chart for details).    MDM Rules/Calculators/A&P                     43yM with CP. Seems atypical for ACS. Troponin mildly elevated but flat over two days. Very hypertensive. Restarted on prior medications. Needs to obtain stress test. Discuss radiographic findings and need to follow-up. Copy of imaging provided at DC. Return precautions  discussed.   Final Clinical Impression(s) / ED Diagnoses Final diagnoses:  Atypical chest pain  Hypertension, unspecified type  Lung nodule    Rx / DC Orders ED Discharge Orders    None       Raeford RazorKohut, [place] Viereck, MD 11/27/19 1043

## 2019-11-22 NOTE — ED Triage Notes (Signed)
Per pt, states he left [facility] due to being 'irritated"-states he is still having CP and elevated BP

## 2019-12-16 ENCOUNTER — Ambulatory Visit: Payer: Self-pay | Admitting: Family Medicine

## 2019-12-19 MED FILL — hydrALAZINE HCL 25 MG TABS: 25 | 30 days supply | Qty: 60 | Fill #0

## 2019-12-19 MED FILL — HYDROCHLOROTHIAZIDE 25 MG T: 25 | 30 days supply | Qty: 30 | Fill #0

## 2019-12-19 MED FILL — AMLODIPINE BESYLATE 10 MG T: 10 | 30 days supply | Qty: 30 | Fill #0

## 2019-12-30 ENCOUNTER — Ambulatory Visit: Payer: Self-pay | Admitting: Family Medicine

## 2019-12-30 ENCOUNTER — Other Ambulatory Visit: Payer: Self-pay

## 2020-01-18 ENCOUNTER — Ambulatory Visit: Payer: Self-pay | Attending: Family Medicine | Admitting: Family Medicine

## 2020-01-18 ENCOUNTER — Other Ambulatory Visit: Payer: Self-pay

## 2020-01-18 ENCOUNTER — Encounter: Payer: Self-pay | Admitting: Family Medicine

## 2020-01-18 VITALS — BP 163/103 | HR 108 | Temp 97.5°F | Resp 16 | Wt 229.0 lb

## 2020-01-18 DIAGNOSIS — I10 Essential (primary) hypertension: Secondary | ICD-10-CM

## 2020-01-18 DIAGNOSIS — R911 Solitary pulmonary nodule: Secondary | ICD-10-CM

## 2020-01-18 DIAGNOSIS — Z720 Tobacco use: Secondary | ICD-10-CM

## 2020-01-18 DIAGNOSIS — Z09 Encounter for follow-up examination after completed treatment for conditions other than malignant neoplasm: Secondary | ICD-10-CM

## 2020-01-18 DIAGNOSIS — N183 Chronic kidney disease, stage 3 unspecified: Secondary | ICD-10-CM

## 2020-01-18 DIAGNOSIS — R079 Chest pain, unspecified: Secondary | ICD-10-CM

## 2020-01-18 NOTE — Patient Instructions (Signed)
Chronic Kidney Disease, Adult Chronic kidney disease (CKD) happens when the kidneys are damaged over a long period of time. The kidneys are two organs that help with:  Getting rid of waste and extra fluid from the blood.  Making hormones that maintain the amount of fluid in your tissues and blood vessels.  Making sure that the body has the right amount of fluids and chemicals. Most of the time, CKD does not go away, but it can usually be controlled. Steps must be taken to slow down the kidney damage or to stop it from getting worse. If this is not done, the kidneys may stop working. Follow these instructions at home: Medicines  Take over-the-counter and prescription medicines only as told by your doctor. You may need to change the amount of medicines you take.  Do not take any new medicines unless your doctor says it is okay. Many medicines can make your kidney damage worse.  Do not take any vitamin and supplements unless your doctor says it is okay. Many vitamins and supplements can make your kidney damage worse. General instructions  Follow a diet as told by your doctor. You may need to stay away from: ? Alcohol. ? Salty foods. ? Foods that are high in:  Potassium.  Calcium.  Protein.  Do not use any products that contain nicotine or tobacco, such as cigarettes and e-cigarettes. If you need help quitting, ask your doctor.  Keep track of your blood pressure at home. Tell your doctor about any changes.  If you have diabetes, keep track of your blood sugar as told by your doctor.  Try to stay at a healthy weight. If you need help, ask your doctor.  Exercise at least 30 minutes a day, 5 days a week.  Stay up-to-date with your shots (immunizations) as told by your doctor.  Keep all follow-up visits as told by your doctor. This is important. Contact a doctor if:  Your symptoms get worse.  You have new symptoms. Get help right away if:  You have symptoms of end-stage  kidney disease. These may include: ? Headaches. ? Numbness in your hands or feet. ? Easy bruising. ? Having hiccups often. ? Chest pain. ? Shortness of breath. ? Stopping of menstrual periods in women.  You have a fever.  You have very little pee (urine).  You have pain or bleeding when you pee. Summary  Chronic kidney disease (CKD) happens when the kidneys are damaged over a long period of time.  Most of the time, this condition does not go away, but it can usually be controlled. Steps must be taken to slow down the kidney damage or to stop it from getting worse.  Treatment may include a combination of medicines and lifestyle changes. This information is not intended to replace advice given to you by your health care provider. Make sure you discuss any questions you have with your health care provider. Document Revised: 10/30/2017 Document Reviewed: 12/22/2016 Elsevier Patient Education  2020 Elsevier Inc.  Hypertension, Adult Hypertension is another name for high blood pressure. High blood pressure forces your heart to work harder to pump blood. This can cause problems over time. There are two numbers in a blood pressure reading. There is a top number (systolic) over a bottom number (diastolic). It is best to have a blood pressure that is below 120/80. Healthy choices can help lower your blood pressure, or you may need medicine to help lower it. What are the causes? The cause of   this condition is not known. Some conditions may be related to high blood pressure. What increases the risk?  Smoking.  Having type 2 diabetes mellitus, high cholesterol, or both.  Not getting enough exercise or physical activity.  Being overweight.  Having too much fat, sugar, calories, or salt (sodium) in your diet.  Drinking too much alcohol.  Having long-term (chronic) kidney disease.  Having a family history of high blood pressure.  Age. Risk increases with age.  Race. You may be at  higher risk if you are African American.  Gender. Men are at higher risk than women before age 45. After age 65, women are at higher risk than men.  Having obstructive sleep apnea.  Stress. What are the signs or symptoms?  High blood pressure may not cause symptoms. Very high blood pressure (hypertensive crisis) may cause: ? Headache. ? Feelings of worry or nervousness (anxiety). ? Shortness of breath. ? Nosebleed. ? A feeling of being sick to your stomach (nausea). ? Throwing up (vomiting). ? Changes in how you see. ? Very bad chest pain. ? Seizures. How is this treated?  This condition is treated by making healthy lifestyle changes, such as: ? Eating healthy foods. ? Exercising more. ? Drinking less alcohol.  Your health care provider may prescribe medicine if lifestyle changes are not enough to get your blood pressure under control, and if: ? Your top number is above 130. ? Your bottom number is above 80.  Your personal target blood pressure may vary. Follow these instructions at home: Eating and drinking   If told, follow the DASH eating plan. To follow this plan: ? Fill one half of your plate at each meal with fruits and vegetables. ? Fill one fourth of your plate at each meal with whole grains. Whole grains include whole-wheat pasta, brown rice, and whole-grain bread. ? Eat or drink low-fat dairy products, such as skim milk or low-fat yogurt. ? Fill one fourth of your plate at each meal with low-fat (lean) proteins. Low-fat proteins include fish, chicken without skin, eggs, beans, and tofu. ? Avoid fatty meat, cured and processed meat, or chicken with skin. ? Avoid pre-made or processed food.  Eat less than 1,500 mg of salt each day.  Do not drink alcohol if: ? Your doctor tells you not to drink. ? You are pregnant, may be pregnant, or are planning to become pregnant.  If you drink alcohol: ? Limit how much you use to:  0-1 drink a day for women.  0-2  drinks a day for men. ? Be aware of how much alcohol is in your drink. In the [place] one drink equals one 12 oz bottle of beer (355 mL), one 5 oz glass of wine (148 mL), or one 1 oz glass of hard liquor (44 mL). Lifestyle   Work with your doctor to stay at a healthy weight or to lose weight. Ask your doctor what the best weight is for you.  Get at least 30 minutes of exercise most days of the week. This may include walking, swimming, or biking.  Get at least 30 minutes of exercise that strengthens your muscles (resistance exercise) at least 3 days a week. This may include lifting weights or doing Pilates.  Do not use any products that contain nicotine or tobacco, such as cigarettes, e-cigarettes, and chewing tobacco. If you need help quitting, ask your doctor.  Check your blood pressure at home as told by your doctor.  Keep all follow-up visits as   told by your doctor. This is important. Medicines  Take over-the-counter and prescription medicines only as told by your doctor. Follow directions carefully.  Do not skip doses of blood pressure medicine. The medicine does not work as well if you skip doses. Skipping doses also puts you at risk for problems.  Ask your doctor about side effects or reactions to medicines that you should watch for. Contact a doctor if you:  Think you are having a reaction to the medicine you are taking.  Have headaches that keep coming back (recurring).  Feel dizzy.  Have swelling in your ankles.  Have trouble with your vision. Get help right away if you:  Get a very bad headache.  Start to feel mixed up (confused).  Feel weak or numb.  Feel faint.  Have very bad pain in your: ? Chest. ? Belly (abdomen).  Throw up more than once.  Have trouble breathing. Summary  Hypertension is another name for high blood pressure.  High blood pressure forces your heart to work harder to pump blood.  For most people, a normal blood pressure is less  than 120/80.  Making healthy choices can help lower blood pressure. If your blood pressure does not get lower with healthy choices, you may need to take medicine. This information is not intended to replace advice given to you by your health care provider. Make sure you discuss any questions you have with your health care provider. Document Revised: 07/28/2018 Document Reviewed: 07/28/2018 Elsevier Patient Education  2020 Elsevier Inc.  

## 2020-01-18 NOTE — Progress Notes (Signed)
Subjective:  Patient ID: Anthony Estrada, male    DOB: Sep 15, 1976  Age: 44 y.o. MRN: [id number]  CC: Hospitalization Follow-up (ED)   HPI Anthony Estrada, 44 year old male who was last seen in the office on 12/15/2018 by another provider to establish care for his chronic issues including hypertension.  He has most recently been seen on 11/21/2019 at [facility] emergency department due to the complaint of chest pain, shortness of breath and cough with clear sputum x1 week patient at that time had also been off of his antihypertensive medications and blood pressure at the emergency department was 174/102.  He also had creatinine elevated at 2.05.  EKG with normal sinus rhythm with possible lateral ischemia and prolonged QT.  Patient was prescribed amlodipine.  Patient left AMA before further evaluation.  Patient went to [facility] on 11/22/2019 due to his continued complaints of chest pain/chest pressure in the left chest.  Blood pressure was elevated at 197/149.  BMP with glucose of 111, creatinine of 1.81 with EGFR 52.  Patient also had increased troponin at 59 with repeat at 56.  EKG showed biatrial enlargement, probable LVH and borderline QT interval prolongation.  Per ED notes, patient was encouraged to have cardiac stress test.  Additionally, patient with pulmonary nodule seen on CT scan.         At today's visit, patient reports that he is taking his blood pressure medications status post emergency department discharge.  Patient states that what he wants to now is whether or not, the pulmonary nodule that was seen on his CT scan might require biopsy or further imaging.  Patient states that he was told to ask about the lung nodule when he followed up with his primary care physician.  He has not had any chest pain to the degree of his prior chest pain which saw him to seek evaluation at the emergency department.  He does still have some mid to left sided chest  discomfort/pressure.  He does not feel as if he has any nausea, sweating or radiation of pain to the neck, jaw, upper back or arms with chest pain at this time.  He denies any shortness of breath but has occasional cough as he does continue to smoke.  He is not really aware of the elevated creatinine/chronic kidney disease from blood work done during his ED visits.  He denies current headaches or dizziness related to his blood pressure.  Past Medical History:  Diagnosis Date  . Hypertension     Past Surgical History:  Procedure Laterality Date  . no past surgery      Family History  Problem Relation Age of Onset  . Hypertension Mother   . Hypertension Father     Social History   Tobacco Use  . Smoking status: Current Every Day Smoker    Packs/day: 1.00    Types: Cigarettes  . Smokeless tobacco: Never Used  Substance Use Topics  . Alcohol use: Never    ROS Review of Systems  Constitutional: Positive for fatigue (Occasional). Negative for chills and fever.  HENT: Negative for sore throat and trouble swallowing.   Eyes: Negative for photophobia and visual disturbance.  Respiratory: Positive for cough (Occasional, nonproductive). Negative for shortness of breath.   Cardiovascular: Positive for chest pain. Negative for palpitations and leg swelling.  Gastrointestinal: Negative for abdominal pain, blood in stool, constipation, diarrhea and nausea.  Endocrine: Negative for polydipsia, polyphagia and polyuria.  Genitourinary: Negative  for dysuria and frequency.  Musculoskeletal: Negative for arthralgias and back pain.  Neurological: Positive for headaches (No current headaches but has had prior headaches). Negative for dizziness.  Hematological: Negative for adenopathy. Does not bruise/bleed easily.  Psychiatric/Behavioral: Negative for self-injury and suicidal ideas.    Objective:   Today's Vitals: BP (!) 163/103   Pulse (!) 108   Temp (!) 97.5 F (36.4 C)   Resp 16   Wt  229 lb (103.9 kg)   SpO2 96%   BMI 39.31 kg/m   Physical Exam Vitals and nursing note reviewed.  Constitutional:      Appearance: Normal appearance.     Comments: Well-nourished well-developed male in no acute distress wearing mask as per office COVID-19 protocol.  Patient is muscular and not morbidly obese as BMI would indicate  Neck:     Vascular: No carotid bruit.  Cardiovascular:     Rate and Rhythm: Normal rate and regular rhythm.  Pulmonary:     Effort: Pulmonary effort is normal.     Breath sounds: Normal breath sounds.  Abdominal:     Palpations: Abdomen is soft.     Tenderness: There is no abdominal tenderness. There is no right CVA tenderness, left CVA tenderness, guarding or rebound.  Musculoskeletal:        General: No swelling, tenderness or deformity.     Cervical back: Normal range of motion and neck supple. No rigidity or tenderness.     Right lower leg: No edema.     Left lower leg: No edema.  Lymphadenopathy:     Cervical: No cervical adenopathy.  Skin:    General: Skin is warm and dry.  Neurological:     General: No focal deficit present.     Mental Status: He is alert and oriented to person, place, and time.  Psychiatric:        Mood and Affect: Mood normal.        Behavior: Behavior normal.     Assessment & Plan:  1. Uncontrolled hypertension; 2.  Chest pain; 3.  Stage III chronic kidney disease; encounter for examination following treatment at hospital Patient with history of uncontrolled hypertension and initial blood pressure reading at today's visit was elevated but rechecked at the end of visit and was within normal, less than 140/90.  Patient is to continue his current medications and compliance of medication stressed to help prevent further worsening of renal disease, worsening of LVH/onset of CHF and for stroke prevention.  Patient had abnormal EKGs and abnormal troponin levels during his ED visits.  Per hospital discharge summary from ED visit  11/22/2019 at [facility], patient was told to follow-up with PCP in order to have referral for cardiac stress test.  Patient is being referred to cardiology for further evaluation and treatment of his chest pain and history of uncontrolled hypertension.  Referral is being placed to nephrology and will obtain renal panel at today's visit and follow-up of patient's elevated creatinine consistent with chronic kidney disease.  Patient was provided with educational material on hypertension and chronic kidney disease as part of his after visit summary.  We will also check CBC to look for anemia associated with chronic kidney disease. - Ambulatory referral to Cardiology - Ambulatory referral to Nephrology - Renal Function Panel - CBC  4. Pulmonary nodule seen on imaging study 5. Solitary pulmonary nodule; 6.  Tobacco use Patient has CT angio chest imaging to look for possible pulmonary embolism as a cause of his  chest pain during his 11/22/2019 visit at the [facility] emergency department.  A 1.6 cm left upper lobe pulmonary nodule was noted with recommendation for 19-monthrepeat chest CT, follow-up PET-CT or tissue sampling.  Patient will have repeat CT scheduled and as patient continues to smoke and has known pulmonary nodule, he will also be referred to pulmonology for further evaluation and treatment.  Patient declines referral to meet with clinical pharmacist regarding smoking cessation but is aware that he can call back to have this rescheduled should he change his mind.  Need for smoking cessation was discussed with the patient at today's visit. - CT Chest Wo Contrast; Future - Ambulatory referral to Pulmonology  Outpatient Encounter Medications as of 01/18/2020  Medication Sig  . amLODipine (NORVASC) 10 MG tablet Take 1 tablet (10 mg total) by mouth daily.  .Marland Kitchenamoxicillin (AMOXIL) 500 MG capsule Take 2 capsules (1,000 mg total) by mouth 2 (two) times daily. (Patient not taking: Reported on 01/18/2020)   . aspirin EC 325 MG tablet Take 1 tablet (325 mg total) by mouth daily.  . hydrALAZINE (APRESOLINE) 25 MG tablet Take 1 tablet (25 mg total) by mouth 2 (two) times daily.  . hydrochlorothiazide (HYDRODIURIL) 25 MG tablet Take 1 tablet (25 mg total) by mouth daily.   No facility-administered encounter medications on file as of 01/18/2020.    An After Visit Summary was printed and given to the patient.   Follow-up: Return in about 4 weeks (around 02/15/2020).  Patient was also instructed to call the office if he has not heard anything by this time next week regarding follow-up with cardiology  CAntony BlackbirdMD

## 2020-01-19 LAB — RENAL FUNCTION PANEL
Albumin: 4 g/dL (ref 4.0–5.0)
BUN/Creatinine Ratio: 13 (ref 9–20)
BUN: 22 mg/dL (ref 6–24)
CO2: 23 mmol/L (ref 20–29)
Calcium: 9.3 mg/dL (ref 8.7–10.2)
Chloride: 106 mmol/L (ref 96–106)
Creatinine, Ser: 1.7 mg/dL — ABNORMAL HIGH (ref 0.76–1.27)
GFR calc Af Amer: 55 mL/min/1.73 — ABNORMAL LOW
GFR calc non Af Amer: 48 mL/min/1.73 — ABNORMAL LOW
Glucose: 126 mg/dL — ABNORMAL HIGH (ref 65–99)
Phosphorus: 4.2 mg/dL — ABNORMAL HIGH (ref 2.8–4.1)
Potassium: 3.9 mmol/L (ref 3.5–5.2)
Sodium: 142 mmol/L (ref 134–144)

## 2020-01-19 LAB — CBC
Hematocrit: 39.1 % (ref 37.5–51.0)
Hemoglobin: 13.5 g/dL (ref 13.0–17.7)
MCH: 30.2 pg (ref 26.6–33.0)
MCHC: 34.5 g/dL (ref 31.5–35.7)
MCV: 88 fL (ref 79–97)
Platelets: 211 x10E3/uL (ref 150–450)
RBC: 4.47 x10E6/uL (ref 4.14–5.80)
RDW: 13.7 % (ref 11.6–15.4)
WBC: 6.7 x10E3/uL (ref 3.4–10.8)

## 2020-01-25 ENCOUNTER — Ambulatory Visit (HOSPITAL_COMMUNITY): Payer: Self-pay | Attending: Family Medicine

## 2020-02-07 ENCOUNTER — Ambulatory Visit: Payer: Self-pay | Admitting: Cardiology

## 2020-02-20 ENCOUNTER — Encounter: Payer: Self-pay | Admitting: General Practice

## 2020-02-25 ENCOUNTER — Other Ambulatory Visit: Payer: Self-pay

## 2020-02-25 ENCOUNTER — Encounter (HOSPITAL_BASED_OUTPATIENT_CLINIC_OR_DEPARTMENT_OTHER): Payer: Self-pay | Admitting: Emergency Medicine

## 2020-02-25 ENCOUNTER — Emergency Department (HOSPITAL_BASED_OUTPATIENT_CLINIC_OR_DEPARTMENT_OTHER)
Admission: EM | Admit: 2020-02-25 | Discharge: 2020-02-25 | Disposition: A | Discharge status: Home / Self Care | Payer: Self-pay | Attending: Emergency Medicine | Admitting: Emergency Medicine

## 2020-02-25 DIAGNOSIS — Z79899 Other long term (current) drug therapy: Secondary | ICD-10-CM | POA: Insufficient documentation

## 2020-02-25 DIAGNOSIS — I10 Essential (primary) hypertension: Secondary | ICD-10-CM | POA: Insufficient documentation

## 2020-02-25 DIAGNOSIS — Z885 Allergy status to narcotic agent status: Secondary | ICD-10-CM | POA: Insufficient documentation

## 2020-02-25 DIAGNOSIS — Z91013 Allergy to seafood: Secondary | ICD-10-CM | POA: Insufficient documentation

## 2020-02-25 DIAGNOSIS — F17210 Nicotine dependence, cigarettes, uncomplicated: Secondary | ICD-10-CM | POA: Insufficient documentation

## 2020-02-25 DIAGNOSIS — J039 Acute tonsillitis, unspecified: Secondary | ICD-10-CM | POA: Insufficient documentation

## 2020-02-25 LAB — GROUP A STREP BY PCR: Group A Strep by PCR: NOT DETECTED

## 2020-02-25 MED ORDER — PENICILLIN G BENZATHINE 1200000 UNIT/2ML IM SUSP
1.2000 10*6.[IU] | Freq: Once | INTRAMUSCULAR | Status: AC
  Administered 2020-02-25: 01:00:00 1.2 10*6.[IU] via INTRAMUSCULAR
  Filled 2020-02-25: qty 2

## 2020-02-25 NOTE — ED Triage Notes (Signed)
Pt states he started having white patches on his mouth yesterday and having 7/10 soreness on his throat.

## 2020-02-25 NOTE — ED Provider Notes (Signed)
[facility] Provider Note: Anthony Dell, MD, FACEP  CSN: [id number] MRN: [id number] ARRIVAL: 02/25/20 at 0013 ROOM: [facility]06/[facility]06   CHIEF COMPLAINT  Sore Throat   HISTORY OF PRESENT ILLNESS  02/25/20 12:28 AM Barbette Hair Anthony Estrada is a 44 y.o. male with a 1 day history of sore throat which he rates as a 7 out of 10, worse with swallowing.  He characterizes it as like previous strep throat.  He has associated erythema, swelling and exudate in his throat.  He has some mild anterior cervical lymphadenopathy.  He is not aware of having a fever.   Past Medical History:  Diagnosis Date  . Hypertension     Past Surgical History:  Procedure Laterality Date  . no past surgery      Family History  Problem Relation Age of Onset  . Hypertension Mother   . Hypertension Father     Social History   Tobacco Use  . Smoking status: Current Every Day Smoker    Packs/day: 1.00    Types: Cigarettes  . Smokeless tobacco: Never Used  Substance Use Topics  . Alcohol use: Never  . Drug use: Never    Prior to Admission medications   Medication Sig Start Date End Date Taking? Authorizing Provider  amLODipine (NORVASC) 10 MG tablet Take 1 tablet (10 mg total) by mouth daily. 11/21/19   Petrucelli, Pleas Koch, PA-C  aspirin EC 325 MG tablet Take 1 tablet (325 mg total) by mouth daily. 11/22/19   Raeford Razor, MD  hydrALAZINE (APRESOLINE) 25 MG tablet Take 1 tablet (25 mg total) by mouth 2 (two) times daily. 11/22/19   Raeford Razor, MD  hydrochlorothiazide (HYDRODIURIL) 25 MG tablet Take 1 tablet (25 mg total) by mouth daily. 11/22/19   Raeford Razor, MD    Allergies Codeine and Shellfish allergy   REVIEW OF SYSTEMS  Negative except as noted here or in the History of Present Illness.   PHYSICAL EXAMINATION  Initial Vital Signs Blood pressure (!) 161/95, pulse 82, temperature 98.5 F (36.9 C), temperature source Oral, resp. rate 19, height 5\' 4"  (1.626 m), weight 99.8 kg,  SpO2 99 %.  Examination General: Well-developed, well-nourished male in no acute distress; appearance consistent with age of record HENT: normocephalic; atraumatic; uvula midline; tonsillar erythema and exudate; no trismus; no dysphonia Eyes: pupils equal, round and reactive to light; extraocular muscles intact Neck: supple; mild anterior cervical lymphadenopathy, no posterior cervical lymphadenopathy Heart: regular rate and rhythm Lungs: clear to auscultation bilaterally Abdomen: soft; nondistended; nontender; bowel sounds present Extremities: No deformity; full range of motion Neurologic: Awake, alert and oriented; motor function intact in all extremities and symmetric; no facial droop Skin: Warm and dry; no rash Psychiatric: Normal mood and affect   RESULTS  Summary of this visit's results, reviewed and interpreted by myself:   EKG Interpretation  Date/Time:    Ventricular Rate:    PR Interval:    QRS Duration:   QT Interval:    QTC Calculation:   R Axis:     Text Interpretation:        Laboratory Studies: Results for orders placed or performed during the hospital encounter of 02/25/20 (from the past 24 hour(s))  Group A Strep by PCR     Status: None   Collection Time: 02/25/20 12:33 AM   Specimen: Throat; Sterile Swab  Result Value Ref Range   Group A Strep by PCR NOT DETECTED NOT DETECTED   Imaging Studies: No results found.  ED COURSE and MDM  Nursing notes, initial and subsequent vitals signs, including pulse oximetry, reviewed and interpreted by myself.  Vitals:   02/25/20 0023 02/25/20 0024  BP: (!) 161/95   Pulse: 82   Resp: 19   Temp: 98.5 F (36.9 C)   TempSrc: Oral   SpO2: 99%   Weight:  99.8 kg  Height:  5\' 4"  (1.626 m)   Medications  penicillin g benzathine (BICILLIN LA) 1200000 UNIT/2ML injection 1.2 Million Units (has no administration in time range)    Strep test is negative but patient has a significant tonsillitis on examination.  Will  treat for this.  PROCEDURES  Procedures   ED DIAGNOSES     ICD-10-CM   1. Tonsillitis  J03.90        Petros Ahart, Jenny Reichmann, MD 02/25/20 [phone]

## 2020-03-13 ENCOUNTER — Encounter: Payer: Self-pay | Admitting: Cardiology

## 2020-03-14 ENCOUNTER — Institutional Professional Consult (permissible substitution): Payer: Self-pay | Admitting: Internal Medicine

## 2020-03-14 NOTE — Progress Notes (Deleted)
         Anthony Estrada    [id number]    [birth year]  Primary Care Physician:Fulp, Hewitt Shorts, MD  Referring Physician: Cain Saupe, MD [address] [place],  [place] [address] Reason for Consultation: "Tobacco use and abnormal nodule on CT in December to look for PE; 1.6 cm nodule in left upper lung and repeat CT pending" Date of Consultation: 03/14/2020  Chief complaint:  No chief complaint on file.    HPI:  *** Social history:  Occupation: Exposures: Smoking history:  Social History   Occupational History  . Not on file  Tobacco Use  . Smoking status: Current Every Day Smoker    Packs/day: 1.00    Types: Cigarettes  . Smokeless tobacco: Never Used  Substance and Sexual Activity  . Alcohol use: Never  . Drug use: Never  . Sexual activity: Yes    Relevant family history: *** Family History  Problem Relation Age of Onset  . Hypertension Mother   . Hypertension Father     Past Medical History:  Diagnosis Date  . Hypertension     Past Surgical History:  Procedure Laterality Date  . no past surgery       Review of systems: ROS  Physical Exam: There were no vitals taken for this visit. Gen:      No acute distress ENT:  ***no nasal polyps, mucus membranes moist Lungs:    No increased respiratory effort, symmetric chest wall excursion, clear to auscultation bilaterally, ***no wheezes or crackles CV:         Regular rate and rhythm; no murmurs, rubs, or gallops.  No pedal edema Abd:      + bowel sounds; soft, non-tender; no distension MSK: no acute synovitis of DIP or PIP joints, no mechanics hands.  Skin:      Warm and dry; no rashes*** Neuro: normal speech, no focal facial asymmetry Psych: alert and oriented x3, normal mood and affect   Data Reviewed/Medical Decision Making:  Independent interpretation of tests: Imaging: . Review of patient's *** images revealed ***. The patient's images have been independently reviewed by me.    PFTs: I  have personally reviewed the patient's PFTs and *** No flowsheet data found.  Labs: ***  Immunization status:   There is no immunization history on file for this patient.  . I reviewed prior external note(s) from *** . I reviewed the result(s) of the labs and imaging as noted above.  . I have ordered ***  Discussion of management or test interpretation with another colleague ***.   Assessment:  ***. 1 or more chronic illnesses with severe exacerbation, progression, or side effects of treatment;  ***. 1 acute or chronic illness or injury that poses a threat to life or bodily function  Plan/Recommendations:   We discussed disease management and progression at length today.   I spent *** minutes in the care of this patient today including pre-charting, chart review, review of results, face-to-face care, coordination of care and communication with consultants etc.).  [id number]  15-29 minutes or Straightforward MDM  [id number] 30-44 minutes or Low level MDM  [id number] 45-59 minutes or Moderate level MDM  [id number] 60-74 minutes or High level MDM   Return to Care: No follow-ups on file.  Durel Salts, MD Pulmonary and Critical Care Medicine [facility] Office:[phone]  CC: Cain Saupe, MD

## 2020-04-19 ENCOUNTER — Ambulatory Visit: Payer: Self-pay | Attending: Family Medicine | Admitting: Family Medicine

## 2020-04-19 ENCOUNTER — Encounter: Payer: Self-pay | Admitting: Family Medicine

## 2020-04-19 ENCOUNTER — Other Ambulatory Visit: Payer: Self-pay

## 2020-04-19 VITALS — BP 184/139 | HR 87 | Temp 98.2°F | Ht 64.0 in | Wt 216.8 lb

## 2020-04-19 DIAGNOSIS — R739 Hyperglycemia, unspecified: Secondary | ICD-10-CM

## 2020-04-19 DIAGNOSIS — R7309 Other abnormal glucose: Secondary | ICD-10-CM

## 2020-04-19 DIAGNOSIS — N183 Chronic kidney disease, stage 3 unspecified: Secondary | ICD-10-CM | POA: Insufficient documentation

## 2020-04-19 DIAGNOSIS — R350 Frequency of micturition: Secondary | ICD-10-CM

## 2020-04-19 DIAGNOSIS — I10 Essential (primary) hypertension: Secondary | ICD-10-CM

## 2020-04-19 DIAGNOSIS — R911 Solitary pulmonary nodule: Secondary | ICD-10-CM | POA: Insufficient documentation

## 2020-04-19 MED ORDER — HYDRALAZINE HCL 25 MG PO TABS: 25.0000 mg | ORAL_TABLET | Freq: Two times a day (BID) | ORAL | 1 refills | Status: DC

## 2020-04-19 MED ORDER — HYDROCHLOROTHIAZIDE 25 MG PO TABS: 25.0000 mg | ORAL_TABLET | Freq: Every day | ORAL | 1 refills | Status: DC

## 2020-04-19 MED ORDER — AMLODIPINE BESYLATE 10 MG PO TABS: 10.0000 mg | ORAL_TABLET | Freq: Every day | ORAL | 1 refills | Status: DC

## 2020-04-19 MED FILL — hydrALAZINE HCL 25 MG TABS: 25 | 30 days supply | Qty: 60 | Fill #0

## 2020-04-19 MED FILL — AMLODIPINE BESYLATE 10 MG T: 10 | 30 days supply | Qty: 30 | Fill #0

## 2020-04-19 MED FILL — HYDROCHLOROTHIAZIDE 25 MG T: 25 | 30 days supply | Qty: 30 | Fill #0
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
## 2020-04-19 NOTE — Progress Notes (Signed)
Established Patient Office Visit  Subjective:  Patient ID: Anthony Estrada, male    DOB: January 27, 1976  Age: 44 y.o. MRN: [id number]  CC: Follow-up hypertension-Jaben Benegas MD  HPI Anthony Estrada, 44 year old male with hypertension, chronic kidney disease and history of pulmonary nodule who is seen in follow-up.  He reports that he has been out of his medication for hypertension and had difficulty when he attempted to obtain refill.  He denies headaches or dizziness related to his blood pressure.  He also reports that he is not sure that he has chronic kidney disease as he was told that his most recent lab report showed normal kidney function.  He is not sure where he received this information from.  He denies any dysuria, no difficulty urinating/initiating urinary stream, no weakening of the urinary stream.  He does urinate frequently including several times per night but states that he also drinks a lot of water throughout the day.  He denies use of protein supplements but does follow high-protein diet.  He does have family history significant for mother and father with hypertension and diabetes.  He denies any issues with increased thirst, he does have mild change in vision as he can no longer read small print as well as he did in the past but he does not have any blurred vision.  Past Medical History:  Diagnosis Date  . Hypertension     Past Surgical History:  Procedure Laterality Date  . no past surgery      Family History  Problem Relation Age of Onset  . Hypertension Mother   . Diabetes Mother   . Hypertension Father   . Diabetes Father     Social History   Socioeconomic History  . Marital status: Single    Spouse name: Not on file  . Number of children: Not on file  . Years of education: Not on file  . Highest education level: Not on file  Occupational History  . Not on file  Tobacco Use  . Smoking status: Current Every Day Smoker    Packs/day: 1.00    Types:  Cigarettes  . Smokeless tobacco: Never Used  Substance and Sexual Activity  . Alcohol use: Never  . Drug use: Never  . Sexual activity: Yes  Other Topics Concern  . Not on file  Social History Narrative  . Not on file   Social Determinants of Health   Financial Resource Strain:   . Difficulty of Paying Living Expenses:   Food Insecurity:   . Worried About Charity fundraiser in the Last Year:   . Arboriculturist in the Last Year:   Transportation Needs:   . Film/video editor (Medical):   Marland Kitchen Lack of Transportation (Non-Medical):   Physical Activity:   . Days of Exercise per Week:   . Minutes of Exercise per Session:   Stress:   . Feeling of Stress :   Social Connections:   . Frequency of Communication with Friends and Family:   . Frequency of Social Gatherings with Friends and Family:   . Attends Religious Services:   . Active Member of Clubs or Organizations:   . Attends Archivist Meetings:   Marland Kitchen Marital Status:   Intimate Partner Violence:   . Fear of Current or Ex-Partner:   . Emotionally Abused:   Marland Kitchen Physically Abused:   . Sexually Abused:     Outpatient Medications Prior to Visit  Medication Sig Dispense Refill  .  aspirin EC 325 MG tablet Take 1 tablet (325 mg total) by mouth daily. 30 tablet 1  . amLODipine (NORVASC) 10 MG tablet Take 1 tablet (10 mg total) by mouth daily. 30 tablet 0  . hydrALAZINE (APRESOLINE) 25 MG tablet Take 1 tablet (25 mg total) by mouth 2 (two) times daily. 60 tablet 1  . hydrochlorothiazide (HYDRODIURIL) 25 MG tablet Take 1 tablet (25 mg total) by mouth daily. 30 tablet 1   No facility-administered medications prior to visit.    Allergies  Allergen Reactions  . Codeine Itching  . Shellfish Allergy Swelling    ROS Review of Systems  Constitutional: Negative for chills, fatigue and fever.  HENT: Negative for sore throat and trouble swallowing.   Eyes: Positive for visual disturbance (Mild difficulty reading small  print). Negative for photophobia.  Respiratory: Negative for cough and shortness of breath.   Cardiovascular: Negative for chest pain, palpitations and leg swelling.  Gastrointestinal: Negative for abdominal pain, blood in stool, constipation and diarrhea.  Endocrine: Positive for polyuria. Negative for cold intolerance, heat intolerance, polydipsia and polyphagia.  Genitourinary: Positive for frequency. Negative for difficulty urinating, dysuria and flank pain.  Musculoskeletal: Negative for arthralgias and back pain.  Neurological: Negative for dizziness and headaches.  Hematological: Negative for adenopathy. Does not bruise/bleed easily.      Objective:    Physical Exam  Constitutional: He is oriented to person, place, and time. He appears well-developed and well-nourished.  Well-nourished well-developed muscular, nonobese male in no acute distress wearing mask as per office COVID-19 protocol  Neck: No JVD present. No thyromegaly present.  Cardiovascular: Normal rate and regular rhythm.  No carotid bruits  Pulmonary/Chest: Effort normal and breath sounds normal.  Abdominal: Soft. There is no abdominal tenderness. There is no rebound and no guarding.  Musculoskeletal:        General: No tenderness or edema.     Cervical back: Normal range of motion and neck supple.  Lymphadenopathy:    He has no cervical adenopathy.  Neurological: He is alert and oriented to person, place, and time.  Skin: Skin is warm and dry.  Psychiatric: He has a normal mood and affect.  Nursing note and vitals reviewed.   BP (!) 184/139   Pulse 87   Temp 98.2 F (36.8 C)   Ht _0  (1.626 m)   Wt 216 lb 12.8 oz (98.3 kg)   SpO2 98%   BMI 37.21 kg/m  Wt Readings from Last 3 Encounters:  04/19/20 216 lb 12.8 oz (98.3 kg)  02/25/20 220 lb (99.8 kg)  01/18/20 229 lb (103.9 kg)     Health Maintenance Due  Topic Date Due  . COVID-19 Vaccine (1) Never done  . TETANUS/TDAP  Never done     No  results found for: TSH Lab Results  Component Value Date   WBC 6.7 01/18/2020   HGB 13.5 01/18/2020   HCT 39.1 01/18/2020   MCV 88 01/18/2020   PLT 211 01/18/2020   Lab Results  Component Value Date   NA 142 01/18/2020   K 3.9 01/18/2020   CO2 23 01/18/2020   GLUCOSE 126 (H) 01/18/2020   BUN 22 01/18/2020   CREATININE 1.70 (H) 01/18/2020   BILITOT 0.6 08/16/2010   ALKPHOS 55 08/16/2010   AST 42 (H) 08/16/2010   ALT 256 (H) 08/16/2010   PROT 7.1 08/16/2010   ALBUMIN 4.0 01/18/2020   CALCIUM 9.3 01/18/2020   ANIONGAP 10 11/22/2019   No results found  for: CHOL No results found for: HDL No results found for: LDLCALC No results found for: TRIG No results found for: CHOLHDL No results found for: HGBA1C    Assessment & Plan:  1. Uncontrolled hypertension Patient has been out of his medication for the past 2 months per his report and was unable to obtain refills.  Patient is provided with 90-day supplies of his current medications, hydrochlorothiazide, hydralazine and amlodipine in order to help increase compliance.  Educational material on managing hypertension provided as part of after visit summary as well as information on chronic kidney disease.  Patient will have renal function panel and urinalysis at today's visit. - Renal Function Panel - POCT URINALYSIS DIP ([facility])  2. Solitary pulmonary nodule Order placed for chest CT without contrast in follow-up of a 1.6 cm left upper lobe pulmonary nodule seen on imaging done 11/21/2020 patient was seen in the emergency department for possible PE. - CT Chest Wo Contrast; Future  3. Stage 3 chronic kidney disease, unspecified whether stage 3a or 3b CKD Patient reports that he believes that he was told that his most recent blood work showed normal kidney function.  On review of chart, patient with last creatinine here in the office of 1.70 and EGFR of 55 on 01/18/2020.  Previous creatinine on 11/22/2019 was 1.81 with EGFR 52.   Discussed importance of controlling blood pressure and patient also works out on a regular basis and follows a high-protein diet and may need to decrease protein intake.  Patient will have renal panel, urinalysis and CBC in follow-up of his chronic kidney disease.  Educational information on chronic kidney disease and food choices and chronic kidney disease provided to patient as part of his after visit summary. - Renal Function Panel - CBC - POCT URINALYSIS DIP ([facility])  4. Elevated random blood glucose level Patient had glucose of 111 on BMP in December 2020 and glucose of 126 on 01/18/2020.  Patient reports urinary frequency but states that he does tend to drink a lot of water.  He also has family history significant for both mother and father with diabetes.  Patient will have hemoglobin A1c at today's visit and glucose will be rechecked as part of renal panel.  He will additionally have urinalysis to look for any glucosuria. - Hemoglobin A1c - POCT URINALYSIS DIP ([facility])  5. Urinary frequency Patient reports urinary frequency but states that he drinks a lot of water.  Patient will have hemoglobin A1c and urinalysis and follow-up at today's visit to look for elevated blood sugar or urinary tract infection. - Hemoglobin A1c - POCT URINALYSIS DIP ([facility])   An After Visit Summary was printed and given to the patient.   Follow-up: Return in about 10 weeks (around 06/28/2020) for HTN: 3-4 weeks with Luke/CPP for BP recheck; sooner if needed; appt for [facility] needed.   Antony Blackbird, MD

## 2020-04-19 NOTE — Patient Instructions (Signed)
Chronic Kidney Disease, Adult Chronic kidney disease (CKD) happens when the kidneys are damaged over a long period of time. The kidneys are two organs that help with:  Getting rid of waste and extra fluid from the blood.  Making hormones that maintain the amount of fluid in your tissues and blood vessels.  Making sure that the body has the right amount of fluids and chemicals. Most of the time, CKD does not go away, but it can usually be controlled. Steps must be taken to slow down the kidney damage or to stop it from getting worse. If this is not done, the kidneys may stop working. Follow these instructions at home: Medicines  Take over-the-counter and prescription medicines only as told by your doctor. You may need to change the amount of medicines you take.  Do not take any new medicines unless your doctor says it is okay. Many medicines can make your kidney damage worse.  Do not take any vitamin and supplements unless your doctor says it is okay. Many vitamins and supplements can make your kidney damage worse. General instructions  Follow a diet as told by your doctor. You may need to stay away from: ? Alcohol. ? Salty foods. ? Foods that are high in:  Potassium.  Calcium.  Protein.  Do not use any products that contain nicotine or tobacco, such as cigarettes and e-cigarettes. If you need help quitting, ask your doctor.  Keep track of your blood pressure at home. Tell your doctor about any changes.  If you have diabetes, keep track of your blood sugar as told by your doctor.  Try to stay at a healthy weight. If you need help, ask your doctor.  Exercise at least 30 minutes a day, 5 days a week.  Stay up-to-date with your shots (immunizations) as told by your doctor.  Keep all follow-up visits as told by your doctor. This is important. Contact a doctor if:  Your symptoms get worse.  You have new symptoms. Get help right away if:  You have symptoms of end-stage  kidney disease. These may include: ? Headaches. ? Numbness in your hands or feet. ? Easy bruising. ? Having hiccups often. ? Chest pain. ? Shortness of breath. ? Stopping of menstrual periods in women.  You have a fever.  You have very little pee (urine).  You have pain or bleeding when you pee. Summary  Chronic kidney disease (CKD) happens when the kidneys are damaged over a long period of time.  Most of the time, this condition does not go away, but it can usually be controlled. Steps must be taken to slow down the kidney damage or to stop it from getting worse.  Treatment may include a combination of medicines and lifestyle changes. This information is not intended to replace advice given to you by your health care provider. Make sure you discuss any questions you have with your health care provider. Document Revised: 10/30/2017 Document Reviewed: 12/22/2016 Elsevier Patient Education  2020 [place] for Chronic Kidney Disease When your kidneys are not working well, they cannot remove waste and excess substances from your blood as effectively as they did before. This can lead to a buildup and imbalance of these substances, which can worsen kidney damage and affect how your body functions. Certain foods lead to a buildup of these substances in the body. By changing your diet as recommended by your diet and nutrition specialist (dietitian) or health care provider, you could help prevent  further kidney damage and delay or prevent the need for dialysis. What are tips for following this plan? General instructions   Work with your health care provider and dietitian to develop a meal plan that is right for you. Foods you can eat, limit, or avoid will be different for each person depending on the stage of kidney disease and any other existing health conditions.  Talk with your health care provider about whether you should take a vitamin and mineral supplement.  Use  standard measuring cups and spoons to measure servings of foods. Use a kitchen scale to measure portions of protein foods.  If directed by your health care provider, avoid drinking too much fluid. Measure and count all liquids, including water, ice, soups, flavored gelatin, and frozen desserts such as popsicles or ice cream. Reading food labels  Check the amount of sodium in foods. Choose foods that have less than 300 milligrams (mg) per serving.  Check the ingredient list for phosphorus or potassium-based additives or preservatives.  Check the amount of saturated and trans fat. Limit or avoid these fats as told by your dietitian. Shopping  Avoid buying foods that are: ? Processed, frozen, or prepackaged. ? Calcium-enriched or fortified.  Do not buy foods that have salt or sodium listed among the first five ingredients.  Do not buy canned vegetables. Cooking  Replace animal proteins, such as meat, fish, eggs, or dairy, with plant proteins from beans, nuts, and soy. ? Use soy milk instead of cow's milk. ? Add beans or tofu to soups, casseroles, or pasta dishes instead of meat.  Soak vegetables, such as potatoes, before cooking to reduce potassium. To do this: ? Peel and cut into small pieces. ? Soak in warm water for at least 2 hours. For every 1 cup of vegetables, use 10 cups of water. ? Drain and rinse with warm water. ? Boil for at least 5 minutes. Meal planning  Limit the amount of protein from plant and animal sources you eat each day.  Do not add salt to food when cooking or before eating.  Eat meals and snacks at around the same time each day. If you have diabetes:  If you have diabetes (diabetes mellitus) and chronic kidney disease, it is important to keep your blood glucose in the target range recommended by your health care provider. Follow your diabetes management plan. This may include: ? Checking your blood glucose regularly. ? Taking oral medicines, insulin, or  both. ? Exercising for at least 30 minutes on 5 or more days each week, or as told by your health care provider. ? Tracking how many servings of carbohydrates you eat at each meal.  You may be given specific guidelines on how much of certain foods and nutrients you may eat, depending on your stage of kidney disease and whether you have high blood pressure (hypertension). Follow your meal plan as told by your dietitian. What nutrients should be limited? The items listed are not a complete list. Talk with your dietitian about what dietary choices are best for you. Potassium Potassium affects how steadily your heart beats. If too much potassium builds up in your blood, it can cause an irregular heartbeat or even a heart attack. You may need to eat less potassium, depending on your blood potassium levels and the stage of kidney disease. Talk to your dietitian about how much potassium you may have each day. You may need to limit or avoid foods that are high in potassium, such as:  Milk and soy milk.  Fruits, such as bananas, papaya, apricots, nectarines, melon, prunes, raisins, kiwi, and oranges.  Vegetables, such as potatoes, sweet potatoes, yams, tomatoes, leafy greens, beets, okra, avocado, pumpkin, and winter squash.  White and lima beans. Phosphorus Phosphorus is a mineral found in your bones. A balance between calcium and phosphorous is needed to build and maintain healthy bones. Too much phosphorus pulls calcium from your bones. This can make your bones weak and more likely to break. Too much phosphorus can also make your skin itch. You may need to eat less phosphorus depending on your blood phosphorus levels and the stage of kidney disease. Talk to your dietitian about how much potassium you may have each day. You may need to take medicine to lower your blood phosphorus levels if diet changes do not help. You may need to limit or avoid foods that are high in phosphorus, such as:  Milk and  dairy products.  Dried beans and peas.  Tofu, soy milk, and other soy-based meat replacements.  Colas.  Nuts and peanut butter.  Meat, poultry, and fish.  Bran cereals and oatmeals. Protein Protein helps you to make and keep muscle. It also helps in the repair of your body's cells and tissues. One of the natural breakdown products of protein is a waste product called urea. When your kidneys are not working properly, they cannot remove wastes, such as urea, like they did before you developed chronic kidney disease. Reducing how much protein you eat can help prevent a buildup of urea in your blood. Depending on your stage of kidney disease, you may need to limit foods that are high in protein. Sources of animal protein include:  Meat (all types).  Fish and seafood.  Poultry.  Eggs.  Dairy. Other protein foods include:  Beans and legumes.  Nuts and nut butter.  Soy and tofu. Sodium Sodium, which is found in salt, helps maintain a healthy balance of fluids in your body. Too much sodium can increase your blood pressure and have a negative effect on the function of your heart and lungs. Too much sodium can also cause your body to retain too much fluid, making your kidneys work harder. Most people should have less than 2,300 milligrams (mg) of sodium each day. If you have hypertension, you may need to limit your sodium to 1,500 mg each day. Talk to your dietitian about how much sodium you may have each day. You may need to limit or avoid foods that are high in sodium, such as:  Salt seasonings.  Soy sauce.  Cured and processed meats.  Salted crackers and snack foods.  Fast food.  Canned soups and most canned foods.  Pickled foods.  Vegetable juice.  Boxed mixes or ready-to-eat boxed meals and side dishes.  Bottled dressings, sauces, and marinades. Summary  Chronic kidney disease can lead to a buildup and imbalance of waste and excess substances in the body. Certain  foods lead to a buildup of these substances. By adjusting your intake of these foods, you could help prevent more kidney damage and delay or prevent the need for dialysis.  Food adjustments are different for each person with chronic kidney disease. Work with a dietitian to set up nutrient goals and a meal plan that is right for you.  If you have diabetes and chronic kidney disease, it is important to keep your blood glucose in the target range recommended by your health care provider. This information is not intended to replace  advice given to you by your health care provider. Make sure you discuss any questions you have with your health care provider. Document Revised: 03/10/2019 Document Reviewed: 11/12/2016 Elsevier Patient Education  2020 [place] Your Hypertension Hypertension is commonly called high blood pressure. This is when the force of your blood pressing against the walls of your arteries is too strong. Arteries are blood vessels that carry blood from your heart throughout your body. Hypertension forces the heart to work harder to pump blood, and may cause the arteries to become narrow or stiff. Having untreated or uncontrolled hypertension can cause heart attack, stroke, kidney disease, and other problems. What are blood pressure readings? A blood pressure reading consists of a higher number over a lower number. Ideally, your blood pressure should be below 120/80. The first ("top") number is called the systolic pressure. It is a measure of the pressure in your arteries as your heart beats. The second ("bottom") number is called the diastolic pressure. It is a measure of the pressure in your arteries as the heart relaxes. What does my blood pressure reading mean? Blood pressure is classified into four stages. Based on your blood pressure reading, your health care provider may use the following stages to determine what type of treatment you need, if any. Systolic pressure and  diastolic pressure are measured in a unit called mm Hg. Normal  Systolic pressure: below 756.  Diastolic pressure: below 80. Elevated  Systolic pressure: 433-295.  Diastolic pressure: below 80. Hypertension stage 1  Systolic pressure: 188-416.  Diastolic pressure: 60-63. Hypertension stage 2  Systolic pressure: 016 or above.  Diastolic pressure: 90 or above. What health risks are associated with hypertension? Managing your hypertension is an important responsibility. Uncontrolled hypertension can lead to:  A heart attack.  A stroke.  A weakened blood vessel (aneurysm).  Heart failure.  Kidney damage.  Eye damage.  Metabolic syndrome.  Memory and concentration problems. What changes can I make to manage my hypertension? Hypertension can be managed by making lifestyle changes and possibly by taking medicines. Your health care provider will help you make a plan to bring your blood pressure within a normal range. Eating and drinking   Eat a diet that is high in fiber and potassium, and low in salt (sodium), added sugar, and fat. An example eating plan is called the DASH (Dietary Approaches to Stop Hypertension) diet. To eat this way: ? Eat plenty of fresh fruits and vegetables. Try to fill half of your plate at each meal with fruits and vegetables. ? Eat whole grains, such as whole wheat pasta, brown rice, or whole grain bread. Fill about one quarter of your plate with whole grains. ? Eat low-fat diary products. ? Avoid fatty cuts of meat, processed or cured meats, and poultry with skin. Fill about one quarter of your plate with lean proteins such as fish, chicken without skin, beans, eggs, and tofu. ? Avoid premade and processed foods. These tend to be higher in sodium, added sugar, and fat.  Reduce your daily sodium intake. Most people with hypertension should eat less than 1,500 mg of sodium a day.  Limit alcohol intake to no more than 1 drink a day for nonpregnant  women and 2 drinks a day for men. One drink equals 12 oz of beer, 5 oz of wine, or 1 oz of hard liquor. Lifestyle  Work with your health care provider to maintain a healthy body weight, or to lose weight. Ask what an  ideal weight is for you.  Get at least 30 minutes of exercise that causes your heart to beat faster (aerobic exercise) most days of the week. Activities may include walking, swimming, or biking.  Include exercise to strengthen your muscles (resistance exercise), such as weight lifting, as part of your weekly exercise routine. Try to do these types of exercises for 30 minutes at least 3 days a week.  Do not use any products that contain nicotine or tobacco, such as cigarettes and e-cigarettes. If you need help quitting, ask your health care provider.  Control any long-term (chronic) conditions you have, such as high cholesterol or diabetes. Monitoring  Monitor your blood pressure at home as told by your health care provider. Your personal target blood pressure may vary depending on your medical conditions, your age, and other factors.  Have your blood pressure checked regularly, as often as told by your health care provider. Working with your health care provider  Review all the medicines you take with your health care provider because there may be side effects or interactions.  Talk with your health care provider about your diet, exercise habits, and other lifestyle factors that may be contributing to hypertension.  Visit your health care provider regularly. Your health care provider can help you create and adjust your plan for managing hypertension. Will I need medicine to control my blood pressure? Your health care provider may prescribe medicine if lifestyle changes are not enough to get your blood pressure under control, and if:  Your systolic blood pressure is 130 or higher.  Your diastolic blood pressure is 80 or higher. Take medicines only as told by your health  care provider. Follow the directions carefully. Blood pressure medicines must be taken as prescribed. The medicine does not work as well when you skip doses. Skipping doses also puts you at risk for problems. Contact a health care provider if:  You think you are having a reaction to medicines you have taken.  You have repeated (recurrent) headaches.  You feel dizzy.  You have swelling in your ankles.  You have trouble with your vision. Get help right away if:  You develop a severe headache or confusion.  You have unusual weakness or numbness, or you feel faint.  You have severe pain in your chest or abdomen.  You vomit repeatedly.  You have trouble breathing. Summary  Hypertension is when the force of blood pumping through your arteries is too strong. If this condition is not controlled, it may put you at risk for serious complications.  Your personal target blood pressure may vary depending on your medical conditions, your age, and other factors. For most people, a normal blood pressure is less than 120/80.  Hypertension is managed by lifestyle changes, medicines, or both. Lifestyle changes include weight loss, eating a healthy, low-sodium diet, exercising more, and limiting alcohol. This information is not intended to replace advice given to you by your health care provider. Make sure you discuss any questions you have with your health care provider. Document Revised: 03/11/2019 Document Reviewed: 10/15/2016 Elsevier Patient Education  2020 [employer].

## 2020-04-20 LAB — RENAL FUNCTION PANEL
Albumin: 4.2 g/dL (ref 4.0–5.0)
BUN/Creatinine Ratio: 10 (ref 9–20)
BUN: 16 mg/dL (ref 6–24)
CO2: 22 mmol/L (ref 20–29)
Calcium: 9.1 mg/dL (ref 8.7–10.2)
Chloride: 104 mmol/L (ref 96–106)
Creatinine, Ser: 1.59 mg/dL — ABNORMAL HIGH (ref 0.76–1.27)
GFR calc Af Amer: 60 mL/min/1.73
GFR calc non Af Amer: 52 mL/min/1.73 — ABNORMAL LOW
Glucose: 99 mg/dL (ref 65–99)
Phosphorus: 3.9 mg/dL (ref 2.8–4.1)
Potassium: 4.2 mmol/L (ref 3.5–5.2)
Sodium: 143 mmol/L (ref 134–144)

## 2020-04-20 LAB — HEMOGLOBIN A1C
Est. average glucose Bld gHb Est-mCnc: 103 mg/dL
Hgb A1c MFr Bld: 5.2 % (ref 4.8–5.6)

## 2020-04-20 LAB — CBC
Hematocrit: 41.8 % (ref 37.5–51.0)
Hemoglobin: 13.6 g/dL (ref 13.0–17.7)
MCH: 29.4 pg (ref 26.6–33.0)
MCHC: 32.5 g/dL (ref 31.5–35.7)
MCV: 91 fL (ref 79–97)
Platelets: 209 x10E3/uL (ref 150–450)
RBC: 4.62 x10E6/uL (ref 4.14–5.80)
RDW: 13.1 % (ref 11.6–15.4)
WBC: 4.9 x10E3/uL (ref 3.4–10.8)

## 2020-04-26 ENCOUNTER — Ambulatory Visit (HOSPITAL_COMMUNITY): Payer: Self-pay

## 2020-08-28 ENCOUNTER — Encounter (HOSPITAL_BASED_OUTPATIENT_CLINIC_OR_DEPARTMENT_OTHER): Payer: Self-pay | Admitting: *Deleted

## 2020-08-28 ENCOUNTER — Other Ambulatory Visit: Payer: Self-pay

## 2020-08-28 ENCOUNTER — Ambulatory Visit: Payer: Self-pay

## 2020-08-28 ENCOUNTER — Emergency Department (HOSPITAL_BASED_OUTPATIENT_CLINIC_OR_DEPARTMENT_OTHER): Payer: Self-pay

## 2020-08-28 DIAGNOSIS — R519 Headache, unspecified: Secondary | ICD-10-CM | POA: Insufficient documentation

## 2020-08-28 DIAGNOSIS — R05 Cough: Secondary | ICD-10-CM | POA: Insufficient documentation

## 2020-08-28 DIAGNOSIS — I129 Hypertensive chronic kidney disease with stage 1 through stage 4 chronic kidney disease, or unspecified chronic kidney disease: Secondary | ICD-10-CM | POA: Insufficient documentation

## 2020-08-28 DIAGNOSIS — R5383 Other fatigue: Secondary | ICD-10-CM | POA: Insufficient documentation

## 2020-08-28 DIAGNOSIS — N183 Chronic kidney disease, stage 3 unspecified: Secondary | ICD-10-CM | POA: Insufficient documentation

## 2020-08-28 DIAGNOSIS — F17210 Nicotine dependence, cigarettes, uncomplicated: Secondary | ICD-10-CM | POA: Insufficient documentation

## 2020-08-28 DIAGNOSIS — R911 Solitary pulmonary nodule: Secondary | ICD-10-CM | POA: Insufficient documentation

## 2020-08-28 DIAGNOSIS — R509 Fever, unspecified: Secondary | ICD-10-CM | POA: Insufficient documentation

## 2020-08-28 DIAGNOSIS — Z20822 Contact with and (suspected) exposure to covid-19: Secondary | ICD-10-CM | POA: Insufficient documentation

## 2020-08-28 DIAGNOSIS — Z7982 Long term (current) use of aspirin: Secondary | ICD-10-CM | POA: Insufficient documentation

## 2020-08-28 LAB — RESPIRATORY PANEL BY RT PCR (FLU A&B, COVID)
Influenza A by PCR: NEGATIVE
Influenza B by PCR: NEGATIVE
SARS Coronavirus 2 by RT PCR: NEGATIVE

## 2020-08-28 NOTE — Telephone Encounter (Signed)
Pt. Reports he has shortness of breath "mainly at night when I'm laying down." "Mild cough and I do smoke." Increased weakness.Tested negative for COVID 19 this week. Requests appointment today or tomorrow. No availability. Please advise pt. Instructed to go to ED for worsening of symptoms.  Reason for Disposition . [1] MILD difficulty breathing (e.g., minimal/no SOB at rest, SOB with walking, pulse <100) AND [2] NEW-onset or WORSE than normal  Answer Assessment - Initial Assessment Questions 1. RESPIRATORY STATUS: "Describe your breathing?" (e.g., wheezing, shortness of breath, unable to speak, severe coughing)      Shortness of breath 2. ONSET: "When did this breathing problem begin?"      1 month ago 3. PATTERN "Does the difficult breathing come and go, or has it been constant since it started?"      Comes and goes 4. SEVERITY: "How bad is your breathing?" (e.g., mild, moderate, severe)    - MILD: No SOB at rest, mild SOB with walking, speaks normally in sentences, can lay down, no retractions, pulse < 100.    - MODERATE: SOB at rest, SOB with minimal exertion and prefers to sit, cannot lie down flat, speaks in phrases, mild retractions, audible wheezing, pulse 100-120.    - SEVERE: Very SOB at rest, speaks in single words, struggling to breathe, sitting hunched forward, retractions, pulse > 120      Moderate 5. RECURRENT SYMPTOM: "Have you had difficulty breathing before?" If Yes, ask: "When was the last time?" and "What happened that time?"      No 6. CARDIAC HISTORY: "Do you have any history of heart disease?" (e.g., heart attack, angina, bypass surgery, angioplasty)      No 7. LUNG HISTORY: "Do you have any history of lung disease?"  (e.g., pulmonary embolus, asthma, emphysema)     No 8. CAUSE: "What do you think is causing the breathing problem?"      Unsure 9. OTHER SYMPTOMS: "Do you have any other symptoms? (e.g., dizziness, runny nose, cough, chest pain, fever)     No 10.  PREGNANCY: "Is there any chance you are pregnant?" "When was your last menstrual period?"       n/a 11. TRAVEL: "Have you traveled out of the country in the last month?" (e.g., travel history, exposures)       No  Protocols used: BREATHING DIFFICULTY-A-AH

## 2020-08-28 NOTE — ED Triage Notes (Signed)
C/o sob cough generalized fatigue x 2 weeks

## 2020-08-29 ENCOUNTER — Encounter (HOSPITAL_BASED_OUTPATIENT_CLINIC_OR_DEPARTMENT_OTHER): Payer: Self-pay | Admitting: Emergency Medicine

## 2020-08-29 ENCOUNTER — Other Ambulatory Visit: Payer: Self-pay

## 2020-08-29 ENCOUNTER — Emergency Department (HOSPITAL_BASED_OUTPATIENT_CLINIC_OR_DEPARTMENT_OTHER)
Admission: EM | Admit: 2020-08-29 | Discharge: 2020-08-29 | Disposition: A | Discharge status: Home / Self Care | Payer: Self-pay | Attending: Emergency Medicine | Admitting: Emergency Medicine

## 2020-08-29 DIAGNOSIS — R911 Solitary pulmonary nodule: Secondary | ICD-10-CM

## 2020-08-29 DIAGNOSIS — I10 Essential (primary) hypertension: Secondary | ICD-10-CM

## 2020-08-29 DIAGNOSIS — N289 Disorder of kidney and ureter, unspecified: Secondary | ICD-10-CM

## 2020-08-29 LAB — URINALYSIS, ROUTINE W REFLEX MICROSCOPIC
Bilirubin Urine: NEGATIVE
Glucose, UA: NEGATIVE mg/dL
Hgb urine dipstick: NEGATIVE
Ketones, ur: NEGATIVE mg/dL
Leukocytes,Ua: NEGATIVE
Nitrite: NEGATIVE
Protein, ur: NEGATIVE mg/dL
Specific Gravity, Urine: >1.03 — ABNORMAL HIGH (ref 1.005–1.030)
pH: 5.5 (ref 5.0–8.0)

## 2020-08-29 LAB — CBC WITH DIFFERENTIAL/PLATELET
Abs Immature Granulocytes: 0.02 10*3/uL (ref 0.00–0.07)
Basophils Absolute: 0 10*3/uL (ref 0.0–0.1)
Basophils Relative: 1 %
Eosinophils Absolute: 0.2 10*3/uL (ref 0.0–0.5)
Eosinophils Relative: 4 %
HCT: 37.1 % — ABNORMAL LOW (ref 39.0–52.0)
Hemoglobin: 12.1 g/dL — ABNORMAL LOW (ref 13.0–17.0)
Immature Granulocytes: 0 %
Lymphocytes Relative: 43 %
Lymphs Abs: 2.4 10*3/uL (ref 0.7–4.0)
MCH: 30.2 pg (ref 26.0–34.0)
MCHC: 32.6 g/dL (ref 30.0–36.0)
MCV: 92.5 fL (ref 80.0–100.0)
Monocytes Absolute: 0.6 10*3/uL (ref 0.1–1.0)
Monocytes Relative: 10 %
Neutro Abs: 2.3 10*3/uL (ref 1.7–7.7)
Neutrophils Relative %: 42 %
Platelets: 184 10*3/uL (ref 150–400)
RBC: 4.01 MIL/uL — ABNORMAL LOW (ref 4.22–5.81)
RDW: 14.1 % (ref 11.5–15.5)
WBC: 5.5 10*3/uL (ref 4.0–10.5)
nRBC: 0 % (ref 0.0–0.2)

## 2020-08-29 LAB — BASIC METABOLIC PANEL
Anion gap: 8 (ref 5–15)
BUN: 18 mg/dL (ref 6–20)
CO2: 24 mmol/L (ref 22–32)
Calcium: 8.3 mg/dL — ABNORMAL LOW (ref 8.9–10.3)
Chloride: 105 mmol/L (ref 98–111)
Creatinine, Ser: 1.76 mg/dL — ABNORMAL HIGH (ref 0.61–1.24)
GFR calc Af Amer: 53 mL/min — ABNORMAL LOW (ref >60)
GFR calc non Af Amer: 46 mL/min — ABNORMAL LOW (ref >60)
Glucose, Bld: 111 mg/dL — ABNORMAL HIGH (ref 70–99)
Potassium: 3.6 mmol/L (ref 3.5–5.1)
Sodium: 137 mmol/L (ref 135–145)

## 2020-08-29 MED ORDER — HYDRALAZINE HCL 25 MG PO TABS
25.0000 mg | ORAL_TABLET | Freq: Once | ORAL | Status: AC
  Administered 2020-08-29: 25 mg via ORAL
  Filled 2020-08-29: qty 1

## 2020-08-29 MED ORDER — HYDRALAZINE HCL 25 MG PO TABS: 25.0000 mg | ORAL_TABLET | Freq: Two times a day (BID) | ORAL | 0 refills | Status: DC

## 2020-08-29 MED ORDER — AMLODIPINE BESYLATE 10 MG PO TABS: 10.0000 mg | ORAL_TABLET | Freq: Every day | ORAL | 0 refills | Status: DC

## 2020-08-29 MED ORDER — HYDROCHLOROTHIAZIDE 25 MG PO TABS: 25.0000 mg | ORAL_TABLET | Freq: Every day | ORAL | 0 refills | Status: DC

## 2020-08-29 MED ORDER — AMLODIPINE BESYLATE 5 MG PO TABS
10.0000 mg | ORAL_TABLET | Freq: Once | ORAL | Status: AC
  Administered 2020-08-29: 10 mg via ORAL
  Filled 2020-08-29: qty 2

## 2020-08-29 MED ORDER — HYDROCHLOROTHIAZIDE 25 MG PO TABS
25.0000 mg | ORAL_TABLET | Freq: Once | ORAL | Status: AC
  Administered 2020-08-29: 25 mg via ORAL
  Filled 2020-08-29: qty 1

## 2020-08-29 MED ORDER — ASPIRIN EC 325 MG PO TBEC: 325.0000 mg | DELAYED_RELEASE_TABLET | Freq: Every day | ORAL | 0 refills | Status: DC

## 2020-08-29 MED FILL — HYDROCHLOROTHIAZIDE 25 MG T: 25 | 14 days supply | Qty: 14 | Fill #0

## 2020-08-29 MED FILL — AMLODIPINE BESYLATE 10 MG T: 10 | 14 days supply | Qty: 14 | Fill #0

## 2020-08-29 MED FILL — hydrALAZINE HCL 25 MG TABS: 25 | 28 days supply | Qty: 28 | Fill #0

## 2020-08-29 NOTE — Telephone Encounter (Signed)
Can you contact patient and find out about his symptoms and see how he can be referred to receive monoclononal antibody therapy to keep symptoms from worsening- ask if he is interested in having a monoclonal antibody infusion and of course advise ED if he is feeling worse

## 2020-08-29 NOTE — Telephone Encounter (Signed)
Per provider, pt can be evaluated at mobile unit tomorrow since no appt available in clinic, ATc pt to inform of this, no answer, LM to [facility].

## 2020-08-29 NOTE — ED Notes (Signed)
ED Provider at bedside. 

## 2020-08-29 NOTE — ED Notes (Signed)
Dr. Bebe Shaggy made aware of pt's BP. No new orders received

## 2020-08-29 NOTE — Discharge Instructions (Addendum)
Please follow-up in the next month for a CAT scan of your chest to rule out cancer

## 2020-08-29 NOTE — ED Provider Notes (Signed)
[facility] EMERGENCY DEPARTMENT Provider Note   CSN: [id number] Arrival date & time: 08/28/20  2150     History Chief Complaint  Patient presents with  . Shortness of Breath    Anthony Estrada is a 44 y.o. male.  The history is provided by the patient.  Shortness of Breath Severity:  Moderate Onset quality:  Gradual Duration:  2 weeks Timing:  Intermittent Progression:  Unchanged Chronicity:  New Relieved by:  Nothing Worsened by:  Nothing Associated symptoms: no abdominal pain and no chest pain   Patient reports over 2 weeks ago he was exposed to COVID-19 at work.  A few days later he had a negative COVID-19 test.  However around that same time he had fever, chills, headache and cough.  He reports he has persistent cough and shortness of breath and fatigue since that time.  He reports increased sleepiness.  No recent fevers or vomiting.  No headache or chest pain.  No new vomiting or diarrhea.  No blood loss.  No blood in stool.  He admits to noncompliance with his blood pressure medications.  No focal weakness is reported.  He is a smoker.     Past Medical History:  Diagnosis Date  . Hypertension     Patient Active Problem List   Diagnosis Date Noted  . Solitary pulmonary nodule 04/19/2020  . Stage 3 chronic kidney disease 04/19/2020  . Chest pain 11/21/2019  . Hypertension 12/15/2018    Past Surgical History:  Procedure Laterality Date  . no past surgery         Family History  Problem Relation Age of Onset  . Hypertension Mother   . Diabetes Mother   . Hypertension Father   . Diabetes Father     Social History   Tobacco Use  . Smoking status: Current Every Day Smoker    Packs/day: 1.00    Types: Cigars  . Smokeless tobacco: Never Used  Vaping Use  . Vaping Use: Never used  Substance Use Topics  . Alcohol use: Never  . Drug use: Never    Home Medications Prior to Admission medications   Medication Sig Start Date End Date  Taking? Authorizing Provider  amLODipine (NORVASC) 10 MG tablet Take 1 tablet (10 mg total) by mouth daily. 08/29/20   Zadie Rhine, MD  aspirin EC 325 MG tablet Take 1 tablet (325 mg total) by mouth daily. 08/29/20   Zadie Rhine, MD  hydrALAZINE (APRESOLINE) 25 MG tablet Take 1 tablet (25 mg total) by mouth in the morning and at bedtime. 08/29/20   Zadie Rhine, MD  hydrochlorothiazide (HYDRODIURIL) 25 MG tablet Take 1 tablet (25 mg total) by mouth daily. 08/29/20   Zadie Rhine, MD    Allergies    Codeine and Shellfish allergy  Review of Systems   Review of Systems  Constitutional: Positive for fatigue. Negative for unexpected weight change.  Respiratory: Positive for shortness of breath.   Cardiovascular: Negative for chest pain.  Gastrointestinal: Negative for abdominal pain and blood in stool.  Neurological: Negative for weakness and numbness.  All other systems reviewed and are negative.   Physical Exam Updated Vital Signs BP (!) 210/136 (BP Location: Right Arm)   Pulse 75   Temp 98.3 F (36.8 C) (Oral)   Resp 20   Ht 1.626 m (5\' 4" )   SpO2 100%   BMI 37.21 kg/m   Physical Exam CONSTITUTIONAL: Well developed/well nourished, sleeping on my arrival to room HEAD: Normocephalic/atraumatic EYES:  EOMI/PERRL, no nystagmus,  no ptosis ENMT: Mucous membranes moist NECK: supple no meningeal signs CV: S1/S2 noted, no murmurs/rubs/gallops noted LUNGS: Lungs are clear to auscultation bilaterally, no apparent distress ABDOMEN: soft, nontender, no rebound or guarding GU:no cva tenderness NEURO:Awake/alert, face symmetric, no arm or leg drift is noted Equal 5/5 strength with shoulder abduction, elbow flex/extension, wrist flex/extension in upper extremities  Equal 5/5 strength with hip flexion,knee flex/extension, foot dorsi/plantar flexion Cranial nerves 3/4/5/6/06/08/09/11/12 tested and intact Gait normal without ataxia EXTREMITIES: pulses normal, full ROM SKIN:  warm, color normal PSYCH: no abnormalities of mood noted  ED Results / Procedures / Treatments   Labs (all labs ordered are listed, but only abnormal results are displayed) Labs Reviewed  BASIC METABOLIC PANEL - Abnormal; Notable for the following components:      Result Value   Glucose, Bld 111 (*)    Creatinine, Ser 1.76 (*)    Calcium 8.3 (*)    GFR calc non Af Amer 46 (*)    GFR calc Af Amer 53 (*)    All other components within normal limits  CBC WITH DIFFERENTIAL/PLATELET - Abnormal; Notable for the following components:   RBC 4.01 (*)    Hemoglobin 12.1 (*)    HCT 37.1 (*)    All other components within normal limits  URINALYSIS, ROUTINE W REFLEX MICROSCOPIC - Abnormal; Notable for the following components:   Specific Gravity, Urine >1.030 (*)    All other components within normal limits  RESPIRATORY PANEL BY RT PCR (FLU A&B, COVID)    EKG EKG Interpretation  Date/Time:  Wednesday August 29 2020 02:23:49 EDT Ventricular Rate:  78 PR Interval:    QRS Duration: 90 QT Interval:  409 QTC Calculation: 466 R Axis:   58 Text Interpretation: Sinus rhythm Biatrial enlargement LVH with secondary repolarization abnormality No significant change was found Confirmed by Zadie Rhine ([id number]) on 08/29/2020 2:31:20 AM   Radiology DG Chest Portable 1 View  Result Date: 08/28/2020 CLINICAL DATA:  Cough and shortness of breath EXAM: PORTABLE CHEST 1 VIEW COMPARISON:  11/22/2019 FINDINGS: 1.6 cm left upper lobe pulmonary nodule is unchanged. Lungs are clear otherwise. No pleural effusion or pneumothorax. Normal cardiomediastinal contours. IMPRESSION: 1. No active cardiopulmonary disease. 2. Unchanged left upper lobe pulmonary nodule. If there has been no follow-up of this nodule since 11/22/2019, chest CT is suggested. Electronically Signed   By: Deatra Robinson M.D.   On: 08/28/2020 23:06    Procedures Procedures   Medications Ordered in ED Medications - No data to  display  ED Course  I have reviewed the triage vital signs and the nursing notes.  Pertinent labs & imaging results that were available during my care of the patient were reviewed by me and considered in my medical decision making (see chart for details).    MDM Rules/Calculators/A&P                          2:37 AM Patient reports Covid exposure over 2 weeks ago, but he has had 2 negative COVID tests.  He reports diagnosis as fatigue, cough and shortness of breath.  He reports he is sleeping frequently. I reviewed the chest x-ray with him.  He was made aware of persistent pulmonary nodule and need for outpatient CT chest.  He is aware that this may indicate cancer and he needs close follow-up.  Patient is a smoker.  It appears his PCP has ordered CT chest without  contrast previously but this is not completed.  Patient understands the importance of follow-up.  A copy of the x-ray report was given to the patient  Patient has been noncompliant with BP medications, he requests refills that were sent to his pharmacy.  Will check labs to see if he has any signs of worsening renal function 3:01 AM Patient has signs of mild worsening renal insufficiency. Patient is otherwise stable at this time.  He reports increased fatigue and sleepiness.  No focal weakness to suggest acute stroke.  He denies any chest pain to suggest acute coronary syndrome. Patient has no signs of acute hypertensive emergency.  I suspect his hypertension is chronic as he is noncompliant w/medications.  Best course of action is slowly decrease his blood pressure at home medications.  Prescriptions have been sent to his pharmacy.  He will follow-up with his PCP for reevaluation, also will need CT chest Counseled on stopping smoking    EIVIN MASCIO was evaluated in Emergency Department on 08/29/2020 for the symptoms described in the history of present illness. He was evaluated in the context of the global COVID-19 pandemic,  which necessitated consideration that the patient might be at risk for infection with the SARS-CoV-2 virus that causes COVID-19. Institutional protocols and algorithms that pertain to the evaluation of patients at risk for COVID-19 are in a state of rapid change based on information released by regulatory bodies including the CDC and federal and state organizations. These policies and algorithms were followed during the patient's care in the ED.  Final Clinical Impression(s) / ED Diagnoses Final diagnoses:  Essential hypertension  Lung nodule  Renal insufficiency    Rx / DC Orders ED Discharge Orders         Ordered    amLODipine (NORVASC) 10 MG tablet  Daily        08/29/20 0233    aspirin EC 325 MG tablet  Daily        08/29/20 0233    hydrALAZINE (APRESOLINE) 25 MG tablet  2 times daily        08/29/20 0233    hydrochlorothiazide (HYDRODIURIL) 25 MG tablet  Daily        08/29/20 0233           Zadie Rhine, MD 08/29/20 0302

## 2020-08-29 NOTE — ED Provider Notes (Signed)
Patient is now requesting a dose of his home blood pressure medications prior to discharge   Anthony Rhine, MD 08/29/20 [phone]

## 2020-09-03 ENCOUNTER — Ambulatory Visit: Payer: Self-pay | Admitting: Family Medicine

## 2020-09-06 NOTE — Telephone Encounter (Signed)
Pt was evaluated at ED on 08-29-20, BP was elevated and SHOB was addressed, pt reports that he is feeling better, is exercising and dieting, scheduled follow-up on 09/13/20 @ 8:30am.

## 2020-09-06 NOTE — Telephone Encounter (Signed)
Patient was on the schedule this week but did not keep his appointment.

## 2020-09-13 ENCOUNTER — Other Ambulatory Visit: Payer: Self-pay | Admitting: Family Medicine

## 2020-09-13 ENCOUNTER — Encounter: Payer: Self-pay | Admitting: Family Medicine

## 2020-09-13 ENCOUNTER — Ambulatory Visit: Payer: Self-pay | Attending: Family Medicine | Admitting: Family Medicine

## 2020-09-13 VITALS — BP 167/108 | HR 94 | Temp 97.7°F | Ht 65.7 in | Wt 213.2 lb

## 2020-09-13 DIAGNOSIS — R911 Solitary pulmonary nodule: Secondary | ICD-10-CM

## 2020-09-13 DIAGNOSIS — R7309 Other abnormal glucose: Secondary | ICD-10-CM

## 2020-09-13 DIAGNOSIS — N183 Chronic kidney disease, stage 3 unspecified: Secondary | ICD-10-CM

## 2020-09-13 DIAGNOSIS — D649 Anemia, unspecified: Secondary | ICD-10-CM

## 2020-09-13 DIAGNOSIS — I10 Essential (primary) hypertension: Secondary | ICD-10-CM

## 2020-09-13 DIAGNOSIS — Z720 Tobacco use: Secondary | ICD-10-CM

## 2020-09-13 DIAGNOSIS — Z09 Encounter for follow-up examination after completed treatment for conditions other than malignant neoplasm: Secondary | ICD-10-CM

## 2020-09-13 DIAGNOSIS — R739 Hyperglycemia, unspecified: Secondary | ICD-10-CM

## 2020-09-13 LAB — POCT GLYCOSYLATED HEMOGLOBIN (HGB A1C): Hemoglobin A1C: 5.3 % (ref 4.0–5.6)

## 2020-09-13 MED ORDER — HYDROCHLOROTHIAZIDE 25 MG PO TABS: 25.0000 mg | ORAL_TABLET | Freq: Every day | ORAL | 5 refills | Status: DC

## 2020-09-13 MED ORDER — HYDRALAZINE HCL 25 MG PO TABS: 25.0000 mg | ORAL_TABLET | Freq: Two times a day (BID) | ORAL | 5 refills | Status: DC

## 2020-09-13 MED ORDER — AMLODIPINE BESYLATE 10 MG PO TABS: 10.0000 mg | ORAL_TABLET | Freq: Every day | ORAL | 5 refills | Status: DC

## 2020-09-13 MED FILL — AMLODIPINE BESYLATE 10 MG T: 10 | 30 days supply | Qty: 30 | Fill #0

## 2020-09-13 MED FILL — hydrALAZINE HCL 25 MG TABS: 25 | 30 days supply | Qty: 60 | Fill #0

## 2020-09-13 MED FILL — HYDROCHLOROTHIAZIDE 25 MG T: 25 | 30 days supply | Qty: 30 | Fill #0

## 2020-09-13 NOTE — Patient Instructions (Signed)
Chronic Kidney Disease, Adult Chronic kidney disease (CKD) happens when the kidneys are damaged over a long period of time. The kidneys are two organs that help with:  Getting rid of waste and extra fluid from the blood.  Making hormones that maintain the amount of fluid in your tissues and blood vessels.  Making sure that the body has the right amount of fluids and chemicals. Most of the time, CKD does not go away, but it can usually be controlled. Steps must be taken to slow down the kidney damage or to stop it from getting worse. If this is not done, the kidneys may stop working. Follow these instructions at home: Medicines  Take over-the-counter and prescription medicines only as told by your doctor. You may need to change the amount of medicines you take.  Do not take any new medicines unless your doctor says it is okay. Many medicines can make your kidney damage worse.  Do not take any vitamin and supplements unless your doctor says it is okay. Many vitamins and supplements can make your kidney damage worse. General instructions  Follow a diet as told by your doctor. You may need to stay away from: ? Alcohol. ? Salty foods. ? Foods that are high in:  Potassium.  Calcium.  Protein.  Do not use any products that contain nicotine or tobacco, such as cigarettes and e-cigarettes. If you need help quitting, ask your doctor.  Keep track of your blood pressure at home. Tell your doctor about any changes.  If you have diabetes, keep track of your blood sugar as told by your doctor.  Try to stay at a healthy weight. If you need help, ask your doctor.  Exercise at least 30 minutes a day, 5 days a week.  Stay up-to-date with your shots (immunizations) as told by your doctor.  Keep all follow-up visits as told by your doctor. This is important. Contact a doctor if:  Your symptoms get worse.  You have new symptoms. Get help right away if:  You have symptoms of end-stage  kidney disease. These may include: ? Headaches. ? Numbness in your hands or feet. ? Easy bruising. ? Having hiccups often. ? Chest pain. ? Shortness of breath. ? Stopping of menstrual periods in women.  You have a fever.  You have very little pee (urine).  You have pain or bleeding when you pee. Summary  Chronic kidney disease (CKD) happens when the kidneys are damaged over a long period of time.  Most of the time, this condition does not go away, but it can usually be controlled. Steps must be taken to slow down the kidney damage or to stop it from getting worse.  Treatment may include a combination of medicines and lifestyle changes. This information is not intended to replace advice given to you by your health care provider. Make sure you discuss any questions you have with your health care provider. Document Revised: 10/30/2017 Document Reviewed: 12/22/2016 Elsevier Patient Education  2020 [employer]  Food Basics for Chronic Kidney Disease When your kidneys are not working well, they cannot remove waste and excess substances from your blood as effectively as they did before. This can lead to a buildup and imbalance of these substances, which can worsen kidney damage and affect how your body functions. Certain foods lead to a buildup of these substances in the body. By changing your diet as recommended by your diet and nutrition specialist (dietitian) or health care provider, you could help prevent   further kidney damage and delay or prevent the need for dialysis. What are tips for following this plan? General instructions   Work with your health care provider and dietitian to develop a meal plan that is right for you. Foods you can eat, limit, or avoid will be different for each person depending on the stage of kidney disease and any other existing health conditions.  Talk with your health care provider about whether you should take a vitamin and mineral supplement.  Use  standard measuring cups and spoons to measure servings of foods. Use a kitchen scale to measure portions of protein foods.  If directed by your health care provider, avoid drinking too much fluid. Measure and count all liquids, including water, ice, soups, flavored gelatin, and frozen desserts such as popsicles or ice cream. Reading food labels  Check the amount of sodium in foods. Choose foods that have less than 300 milligrams (mg) per serving.  Check the ingredient list for phosphorus or potassium-based additives or preservatives.  Check the amount of saturated and trans fat. Limit or avoid these fats as told by your dietitian. Shopping  Avoid buying foods that are: ? Processed, frozen, or prepackaged. ? Calcium-enriched or fortified.  Do not buy foods that have salt or sodium listed among the first five ingredients.  Do not buy canned vegetables. Cooking  Replace animal proteins, such as meat, fish, eggs, or dairy, with plant proteins from beans, nuts, and soy. ? Use soy milk instead of cow's milk. ? Add beans or tofu to soups, casseroles, or pasta dishes instead of meat.  Soak vegetables, such as potatoes, before cooking to reduce potassium. To do this: ? Peel and cut into small pieces. ? Soak in warm water for at least 2 hours. For every 1 cup of vegetables, use 10 cups of water. ? Drain and rinse with warm water. ? Boil for at least 5 minutes. Meal planning  Limit the amount of protein from plant and animal sources you eat each day.  Do not add salt to food when cooking or before eating.  Eat meals and snacks at around the same time each day. If you have diabetes:  If you have diabetes (diabetes mellitus) and chronic kidney disease, it is important to keep your blood glucose in the target range recommended by your health care provider. Follow your diabetes management plan. This may include: ? Checking your blood glucose regularly. ? Taking oral medicines, insulin, or  both. ? Exercising for at least 30 minutes on 5 or more days each week, or as told by your health care provider. ? Tracking how many servings of carbohydrates you eat at each meal.  You may be given specific guidelines on how much of certain foods and nutrients you may eat, depending on your stage of kidney disease and whether you have high blood pressure (hypertension). Follow your meal plan as told by your dietitian. What nutrients should be limited? The items listed are not a complete list. Talk with your dietitian about what dietary choices are best for you. Potassium Potassium affects how steadily your heart beats. If too much potassium builds up in your blood, it can cause an irregular heartbeat or even a heart attack. You may need to eat less potassium, depending on your blood potassium levels and the stage of kidney disease. Talk to your dietitian about how much potassium you may have each day. You may need to limit or avoid foods that are high in potassium, such as:    Milk and soy milk.  Fruits, such as bananas, papaya, apricots, nectarines, melon, prunes, raisins, kiwi, and oranges.  Vegetables, such as potatoes, sweet potatoes, yams, tomatoes, leafy greens, beets, okra, avocado, pumpkin, and winter squash.  White and lima beans. Phosphorus Phosphorus is a mineral found in your bones. A balance between calcium and phosphorous is needed to build and maintain healthy bones. Too much phosphorus pulls calcium from your bones. This can make your bones weak and more likely to break. Too much phosphorus can also make your skin itch. You may need to eat less phosphorus depending on your blood phosphorus levels and the stage of kidney disease. Talk to your dietitian about how much potassium you may have each day. You may need to take medicine to lower your blood phosphorus levels if diet changes do not help. You may need to limit or avoid foods that are high in phosphorus, such as:  Milk and  dairy products.  Dried beans and peas.  Tofu, soy milk, and other soy-based meat replacements.  Colas.  Nuts and peanut butter.  Meat, poultry, and fish.  Bran cereals and oatmeals. Protein Protein helps you to make and keep muscle. It also helps in the repair of your body's cells and tissues. One of the natural breakdown products of protein is a waste product called urea. When your kidneys are not working properly, they cannot remove wastes, such as urea, like they did before you developed chronic kidney disease. Reducing how much protein you eat can help prevent a buildup of urea in your blood. Depending on your stage of kidney disease, you may need to limit foods that are high in protein. Sources of animal protein include:  Meat (all types).  Fish and seafood.  Poultry.  Eggs.  Dairy. Other protein foods include:  Beans and legumes.  Nuts and nut butter.  Soy and tofu. Sodium Sodium, which is found in salt, helps maintain a healthy balance of fluids in your body. Too much sodium can increase your blood pressure and have a negative effect on the function of your heart and lungs. Too much sodium can also cause your body to retain too much fluid, making your kidneys work harder. Most people should have less than 2,300 milligrams (mg) of sodium each day. If you have hypertension, you may need to limit your sodium to 1,500 mg each day. Talk to your dietitian about how much sodium you may have each day. You may need to limit or avoid foods that are high in sodium, such as:  Salt seasonings.  Soy sauce.  Cured and processed meats.  Salted crackers and snack foods.  Fast food.  Canned soups and most canned foods.  Pickled foods.  Vegetable juice.  Boxed mixes or ready-to-eat boxed meals and side dishes.  Bottled dressings, sauces, and marinades. Summary  Chronic kidney disease can lead to a buildup and imbalance of waste and excess substances in the body. Certain  foods lead to a buildup of these substances. By adjusting your intake of these foods, you could help prevent more kidney damage and delay or prevent the need for dialysis.  Food adjustments are different for each person with chronic kidney disease. Work with a dietitian to set up nutrient goals and a meal plan that is right for you.  If you have diabetes and chronic kidney disease, it is important to keep your blood glucose in the target range recommended by your health care provider. This information is not intended to replace   advice given to you by your health care provider. Make sure you discuss any questions you have with your health care provider. Document Revised: 03/10/2019 Document Reviewed: 11/12/2016 Elsevier Patient Education  2020 [employer]  

## 2020-09-13 NOTE — Progress Notes (Signed)
Established Patient Office Visit  Subjective:  Patient ID: Anthony Estrada, male    DOB: 01-22-1976  Age: 44 y.o. MRN: [id number]  CC: Hypertension/ED follow-up  HPI Anthony Estrada , 44 year old male who is status post emergency department visit on 08/29/2020 due to complaint of increasing shortness of breath over 2 weeks.  At the ED, patient reported exposure to Covid 2 weeks prior but his Covid test was negative.  Patient with chronic medical issues including stage III chronic kidney disease, hypertension and solitary pulmonary nodule and continued tobacco use.  Blood pressure in the ED was 210/136.  Lab work showed glucose of 111, creatinine of 1.76 and hemoglobin of 12.1.  EKG with sinus rhythm, biatrial enlargement, LVH with secondary repolarization abnormality but unchanged from prior EKG.  Chest x-ray showed stable 1.6 cm left upper lobe pulmonary nodule.  No active cardiopulmonary disease.  Patient was provided with prescriptions for his amlodipine, hydralazine and hydrochlorothiazide.  He presents today for follow-up.         He reports that since his recent ED visit, he has been taking his blood pressure medication daily as well as exercising and making dietary changes to help lower his blood pressure. He needs refills of his medications as he was only provided with a limited supply from the ED. He denies any current issues with headaches or dizziness, no chest pain or palpitations. Shortness of breath that he reported in the emergency department has resolved. He has had some pain for the past week in the right upper back.          He continues to smoke. He reports that he smokes Black and Mild cigars about 4 to 5/day. He feels that this relieves stress. He tends to smoke during work breaks. He denies any current issues with shortness of breath or cough. He is aware of abnormal chest x-ray showing lung nodule and he would like to have his CT scheduled as soon as possible but preferably  earliest available in the morning.            He reports that he has never seen a nephrologist. He was under the impression that his chronic kidney disease was not really bad as his creatinine is less than 2. He denies any issues with leg swelling/peripheral edema. He does not currently take nonsteroidal anti-inflammatories other than a daily aspirin.  Past Medical History:  Diagnosis Date  . Hypertension     Past Surgical History:  Procedure Laterality Date  . no past surgery      Family History  Problem Relation Age of Onset  . Hypertension Mother   . Diabetes Mother   . Hypertension Father   . Diabetes Father     Social History   Socioeconomic History  . Marital status: Single    Spouse name: Not on file  . Number of children: Not on file  . Years of education: Not on file  . Highest education level: Not on file  Occupational History  . Not on file  Tobacco Use  . Smoking status: Current Every Day Smoker    Packs/day: 1.00    Types: Cigars  . Smokeless tobacco: Never Used  Vaping Use  . Vaping Use: Never used  Substance and Sexual Activity  . Alcohol use: Never  . Drug use: Not on file  . Sexual activity: Yes  Other Topics Concern  . Not on file  Social History Narrative  . Not on file  Social Determinants of Health   Financial Resource Strain:   . Difficulty of Paying Living Expenses: Not on file  Food Insecurity:   . Worried About Charity fundraiser in the Last Year: Not on file  . Ran Out of Food in the Last Year: Not on file  Transportation Needs:   . Lack of Transportation (Medical): Not on file  . Lack of Transportation (Non-Medical): Not on file  Physical Activity:   . Days of Exercise per Week: Not on file  . Minutes of Exercise per Session: Not on file  Stress:   . Feeling of Stress : Not on file  Social Connections:   . Frequency of Communication with Friends and Family: Not on file  . Frequency of Social Gatherings with Friends and  Family: Not on file  . Attends Religious Services: Not on file  . Active Member of Clubs or Organizations: Not on file  . Attends Archivist Meetings: Not on file  . Marital Status: Not on file  Intimate Partner Violence:   . Fear of Current or Ex-Partner: Not on file  . Emotionally Abused: Not on file  . Physically Abused: Not on file  . Sexually Abused: Not on file    Outpatient Medications Prior to Visit  Medication Sig Dispense Refill  . amLODipine (NORVASC) 10 MG tablet Take 1 tablet (10 mg total) by mouth daily. 14 tablet 0  . aspirin EC 325 MG tablet Take 1 tablet (325 mg total) by mouth daily. 14 tablet 0  . hydrALAZINE (APRESOLINE) 25 MG tablet Take 1 tablet (25 mg total) by mouth in the morning and at bedtime. 28 tablet 0  . hydrochlorothiazide (HYDRODIURIL) 25 MG tablet Take 1 tablet (25 mg total) by mouth daily. 14 tablet 0   No facility-administered medications prior to visit.    Allergies  Allergen Reactions  . Codeine Itching  . Shellfish Allergy Swelling    ROS Review of Systems  Constitutional: Negative for chills and fever.  HENT: Negative for sore throat and trouble swallowing.   Eyes: Negative for photophobia and visual disturbance.  Respiratory: Negative for cough and shortness of breath.   Cardiovascular: Negative for chest pain, palpitations and leg swelling.  Gastrointestinal: Negative for abdominal pain, constipation, diarrhea and nausea.  Endocrine: Negative for polydipsia, polyphagia and polyuria.  Genitourinary: Negative for dysuria and frequency.  Musculoskeletal: Positive for back pain. Negative for arthralgias.  Skin: Negative for rash and wound.  Neurological: Negative for dizziness and headaches.  Hematological: Negative for adenopathy. Does not bruise/bleed easily.      Objective:    Physical Exam Vitals and nursing note reviewed.  Constitutional:      General: He is not in acute distress.    Appearance: Normal appearance.  He is not ill-appearing.     Comments: Patient with muscular build and is not obese  Neck:     Vascular: No carotid bruit.  Cardiovascular:     Rate and Rhythm: Normal rate and regular rhythm.  Pulmonary:     Effort: Pulmonary effort is normal.     Breath sounds: Normal breath sounds.  Abdominal:     Palpations: Abdomen is soft.     Tenderness: There is no abdominal tenderness. There is no right CVA tenderness, left CVA tenderness or guarding.  Musculoskeletal:        General: Tenderness (reporducible tenderness at upper medial margin of right scapula) present.     Cervical back: Normal range of motion and  neck supple. No tenderness.     Right lower leg: No edema.     Left lower leg: No edema.  Lymphadenopathy:     Cervical: No cervical adenopathy.  Skin:    General: Skin is warm and dry.  Neurological:     General: No focal deficit present.     Mental Status: He is alert and oriented to person, place, and time.     Cranial Nerves: No cranial nerve deficit.  Psychiatric:        Mood and Affect: Mood normal.        Behavior: Behavior normal.     BP (!) 167/108 (BP Location: Left Arm, Patient Position: Sitting)   Pulse 94   Temp 97.7 F (36.5 C)   Ht 5' 5.7" (1.669 m)   Wt 213 lb 3.2 oz (96.7 kg)   SpO2 99%   BMI 34.73 kg/m  Wt Readings from Last 3 Encounters:  04/19/20 216 lb 12.8 oz (98.3 kg)  02/25/20 220 lb (99.8 kg)  01/18/20 229 lb (103.9 kg)     Health Maintenance Due  Topic Date Due  . COVID-19 Vaccine (1) Never done  . TETANUS/TDAP  Never done  . INFLUENZA VACCINE  Never done     No results found for: TSH Lab Results  Component Value Date   WBC 5.5 08/29/2020   HGB 12.1 (L) 08/29/2020   HCT 37.1 (L) 08/29/2020   MCV 92.5 08/29/2020   PLT 184 08/29/2020   Lab Results  Component Value Date   NA 137 08/29/2020   K 3.6 08/29/2020   CO2 24 08/29/2020   GLUCOSE 111 (H) 08/29/2020   BUN 18 08/29/2020   CREATININE 1.76 (H) 08/29/2020   BILITOT  0.6 08/16/2010   ALKPHOS 55 08/16/2010   AST 42 (H) 08/16/2010   ALT 256 (H) 08/16/2010   PROT 7.1 08/16/2010   ALBUMIN 4.2 04/19/2020   CALCIUM 8.3 (L) 08/29/2020   ANIONGAP 8 08/29/2020   No results found for: CHOL No results found for: HDL No results found for: LDLCALC No results found for: TRIG No results found for: [facility] Lab Results  Component Value Date   HGBA1C 5.2 04/19/2020      Assessment & Plan:  1. Essential hypertension; &. Encounter for examination following treatment at hospital; 3.  Stage III chronic kidney disease Notes from patient's recent emergency department visit were reviewed and discussed with the patient at today's visit.  Refills provided of patient's amlodipine, hydralazine and hydrochlorothiazide.  As patient with chronic kidney disease and recent creatinine of 1.76 with EGFR of 53 in the emergency department, electrolytes will be repeated at today's visit.  Patient will also have lipid panel.  Referral will be made for patient to establish care with nephrology.  He is encouraged to remain well-hydrated with water and avoid nonsteroidal anti-inflammatories and to make sure that his blood pressure remains well controlled.  Blood pressure goal of 130/80 or less.  Patient will return for follow-up with the clinical pharmacist regarding blood pressure recheck.  Information on chronic kidney disease as well as food choices for people with chronic kidney disease given as part of after visit summary. - amLODipine (NORVASC) 10 MG tablet; Take 1 tablet (10 mg total) by mouth daily.  Dispense: 30 tablet; Refill: 5 - hydrALAZINE (APRESOLINE) 25 MG tablet; Take 1 tablet (25 mg total) by mouth in the morning and at bedtime.  Dispense: 60 tablet; Refill: 5 - hydrochlorothiazide (HYDRODIURIL) 25 MG tablet; Take 1  tablet (25 mg total) by mouth daily.  Dispense: 30 tablet; Refill: 5 - Lipid panel - Ambulatory referral to Nephrology - Basic Metabolic Panel te smoking  cessation and patient has agreed to meet with the clinical pharmacist for hel 3. Pulmonary nodule seen on imaging study; 4.  Tobacco use Patient with 1.6 cm left upper lobe pulmonary nodule on chest x-ray at recent emergency department visit that is unchanged from 11/22/2019.  Patient will be scheduled for CT chest nodule follow-up.  Discussed the need for complep to stop smoking. - CT CHEST NODULE FOLLOW UP LOW DOSE W/O; Future  5. Elevated random blood glucose level Patient has had prior elevated random blood sugars including blood sugar of 111 on blood work done at his recent emergency department visit.  Patient does have family history of diabetes.  Patient is fasting at today's visit and will have repeat glucose as part of basic metabolic panel as well as hemoglobin O7S. -Basic metabolic panel -Hemoglobin A1c  6. Normocytic anemia Patient with normocytic anemia with hemoglobin of 12.1 with MCV of 92.5 on CBC done during recent emergency department visit.  He denies any issues with blood in the stool or black stools suggestive of GI bleed.  He does have known chronic kidney disease which may be contributing to his anemia.  Patient will be due for colonoscopy in February but may need to have this done sooner if he does experience any signs or symptoms of GI blood loss.     Follow-up: Return in about 4 months (around 01/14/2021) for HTN/Chronic issues- 4 week f/u with Lurena Joiner for BP/tob use.    Antony Blackbird, MD

## 2020-09-14 LAB — BASIC METABOLIC PANEL WITH GFR
BUN/Creatinine Ratio: 13 (ref 9–20)
BUN: 21 mg/dL (ref 6–24)
CO2: 22 mmol/L (ref 20–29)
Calcium: 9.2 mg/dL (ref 8.7–10.2)
Chloride: 103 mmol/L (ref 96–106)
Creatinine, Ser: 1.56 mg/dL — ABNORMAL HIGH (ref 0.76–1.27)
GFR calc Af Amer: 62 mL/min/1.73
GFR calc non Af Amer: 53 mL/min/1.73 — ABNORMAL LOW
Glucose: 85 mg/dL (ref 65–99)
Potassium: 3.9 mmol/L (ref 3.5–5.2)
Sodium: 139 mmol/L (ref 134–144)

## 2020-09-14 LAB — LIPID PANEL
Chol/HDL Ratio: 2.7 ratio (ref 0.0–5.0)
Cholesterol, Total: 154 mg/dL (ref 100–199)
HDL: 58 mg/dL
LDL Chol Calc (NIH): 84 mg/dL (ref 0–99)
Triglycerides: 59 mg/dL (ref 0–149)
VLDL Cholesterol Cal: 12 mg/dL (ref 5–40)

## 2020-09-26 ENCOUNTER — Telehealth: Payer: Self-pay | Admitting: Family Medicine

## 2020-09-26 NOTE — Telephone Encounter (Signed)
Spoke w/pt and advised him until he completes CT scan on 11/10 Fulp will not be able to advise what is going on with his lungs

## 2020-09-26 NOTE — Telephone Encounter (Signed)
Copied from CRM [id number]. Topic: General - Other >> Sep 26, 2020  3:03 PM Tamela Oddi wrote: Reason for CRM: Patient would like the doctor or nurse to call him regarding a referral.  He stated that he had some questions about the referral.  CB# [phone] (

## 2020-10-07 ENCOUNTER — Emergency Department (HOSPITAL_COMMUNITY)
Admission: EM | Admit: 2020-10-07 | Discharge: 2020-10-07 | Disposition: A | Discharge status: Home / Self Care | Payer: Self-pay | Attending: Emergency Medicine | Admitting: Emergency Medicine

## 2020-10-07 ENCOUNTER — Other Ambulatory Visit: Payer: Self-pay

## 2020-10-07 ENCOUNTER — Encounter (HOSPITAL_COMMUNITY): Payer: Self-pay

## 2020-10-07 DIAGNOSIS — F17290 Nicotine dependence, other tobacco product, uncomplicated: Secondary | ICD-10-CM | POA: Insufficient documentation

## 2020-10-07 DIAGNOSIS — I129 Hypertensive chronic kidney disease with stage 1 through stage 4 chronic kidney disease, or unspecified chronic kidney disease: Secondary | ICD-10-CM | POA: Insufficient documentation

## 2020-10-07 DIAGNOSIS — N183 Chronic kidney disease, stage 3 unspecified: Secondary | ICD-10-CM | POA: Insufficient documentation

## 2020-10-07 DIAGNOSIS — Z79899 Other long term (current) drug therapy: Secondary | ICD-10-CM | POA: Insufficient documentation

## 2020-10-07 DIAGNOSIS — Z7982 Long term (current) use of aspirin: Secondary | ICD-10-CM | POA: Insufficient documentation

## 2020-10-07 DIAGNOSIS — M5442 Lumbago with sciatica, left side: Secondary | ICD-10-CM | POA: Insufficient documentation

## 2020-10-07 MED ORDER — PREDNISONE 20 MG PO TABS
60.0000 mg | ORAL_TABLET | Freq: Once | ORAL | Status: AC
  Administered 2020-10-07: 60 mg via ORAL
  Filled 2020-10-07: qty 3

## 2020-10-07 MED ORDER — LIDOCAINE 5 % EX PTCH
1.0000 | MEDICATED_PATCH | Freq: Once | CUTANEOUS | Status: DC
  Administered 2020-10-07: 1 via TRANSDERMAL
  Filled 2020-10-07: qty 1

## 2020-10-07 MED ORDER — PREDNISONE 20 MG PO TABS: 60.0000 mg | ORAL_TABLET | Freq: Every day | ORAL | 0 refills | Status: AC

## 2020-10-07 MED ORDER — METHOCARBAMOL 500 MG PO TABS
500.0000 mg | ORAL_TABLET | Freq: Once | ORAL | Status: AC
  Administered 2020-10-07: 500 mg via ORAL
  Filled 2020-10-07: qty 1

## 2020-10-07 MED ORDER — METHOCARBAMOL 500 MG PO TABS: 500.0000 mg | ORAL_TABLET | Freq: Two times a day (BID) | ORAL | 0 refills | Status: DC

## 2020-10-07 MED ORDER — OXYCODONE-ACETAMINOPHEN 5-325 MG PO TABS: 1.0000 | ORAL_TABLET | Freq: Four times a day (QID) | ORAL | 0 refills | Status: DC | PRN

## 2020-10-07 MED ORDER — OXYCODONE-ACETAMINOPHEN 5-325 MG PO TABS
1.0000 | ORAL_TABLET | Freq: Once | ORAL | Status: AC
  Administered 2020-10-07: 1 via ORAL
  Filled 2020-10-07: qty 1

## 2020-10-07 NOTE — ED Provider Notes (Signed)
[facility]-EMERGENCY DEPT Provider Note   CSN: [id number] Arrival date & time: 10/07/20  1036     History Chief Complaint  Patient presents with  . Back Pain    Anthony Estrada is a 44 y.o. male.  Anthony Estrada is a 44 y.o. male with a history of HT, who presents to the ED for evaluation of back pain.  Patient states this episode of back pain has been going on for about 2 weeks now.  He reports pain starts in his left low back and he describes it as a pulling sensation.  The pain then radiates into his buttock and hip and down his left leg.  He states that the pain is worse with movement or lifting.  He states that he has been having to work 12-hour shifts at work and is on his feet for most of it and that has significantly worsened his pain.  He denies any loss of bowel or bladder control or saddle anesthesia.  No associated abdominal pain, vomiting, fevers, dysuria or urinary frequency.  Patient denies any recent trauma or injury to the area.  Does report that back in 2014 he had similar pain and it got so bad that he was unable to walk for quite a while, at this time he was in prison and was diagnosed with a herniated disc, he has never had any back surgery or intervention.  States he is not been taking any medication for this currently.  No history of cancer or IV drug use.  Has not followed up with anyone regarding this pain.        Past Medical History:  Diagnosis Date  . Hypertension     Patient Active Problem List   Diagnosis Date Noted  . Solitary pulmonary nodule 04/19/2020  . Stage 3 chronic kidney disease (HCC) 04/19/2020  . Chest pain 11/21/2019  . Hypertension 12/15/2018    Past Surgical History:  Procedure Laterality Date  . no past surgery         Family History  Problem Relation Age of Onset  . Hypertension Mother   . Diabetes Mother   . Hypertension Father   . Diabetes Father     Social History   Tobacco Use  . Smoking  status: Current Every Day Smoker    Packs/day: 1.00    Types: Cigars  . Smokeless tobacco: Never Used  Vaping Use  . Vaping Use: Never used  Substance Use Topics  . Alcohol use: Never  . Drug use: Not on file    Home Medications Prior to Admission medications   Medication Sig Start Date End Date Taking? Authorizing Provider  amLODipine (NORVASC) 10 MG tablet Take 1 tablet (10 mg total) by mouth daily. 09/13/20   Fulp, Cammie, MD  aspirin EC 325 MG tablet Take 1 tablet (325 mg total) by mouth daily. 08/29/20   Zadie Rhine, MD  hydrALAZINE (APRESOLINE) 25 MG tablet Take 1 tablet (25 mg total) by mouth in the morning and at bedtime. 09/13/20   Fulp, Cammie, MD  hydrochlorothiazide (HYDRODIURIL) 25 MG tablet Take 1 tablet (25 mg total) by mouth daily. 09/13/20   Fulp, Cammie, MD    Allergies    Codeine and Shellfish allergy  Review of Systems   Review of Systems  Constitutional: Negative for chills and fever.  HENT: Negative.   Respiratory: Negative for shortness of breath.   Cardiovascular: Negative for chest pain.  Gastrointestinal: Negative for abdominal pain, constipation, diarrhea,  nausea and vomiting.  Genitourinary: Negative for dysuria, flank pain, frequency and hematuria.  Musculoskeletal: Positive for back pain. Negative for arthralgias, gait problem, joint swelling, myalgias and neck pain.  Skin: Negative for color change, rash and wound.  Neurological: Negative for weakness and numbness.    Physical Exam Updated Vital Signs BP (!) 151/112 (BP Location: Right Arm)   Pulse 97   Temp 98.4 F (36.9 C) (Oral)   Resp 18   SpO2 100%   Physical Exam Vitals and nursing note reviewed.  Constitutional:      General: He is not in acute distress.    Appearance: Normal appearance. He is well-developed. He is obese. He is not ill-appearing or diaphoretic.  HENT:     Head: Atraumatic.  Eyes:     General:        Right eye: No discharge.        Left eye: No discharge.   Cardiovascular:     Pulses:          Radial pulses are 2+ on the right side and 2+ on the left side.       Dorsalis pedis pulses are 2+ on the right side and 2+ on the left side.       Posterior tibial pulses are 2+ on the right side and 2+ on the left side.  Pulmonary:     Effort: Pulmonary effort is normal. No respiratory distress.  Abdominal:     General: Bowel sounds are normal. There is no distension.     Palpations: Abdomen is soft. There is no mass.     Tenderness: There is no abdominal tenderness. There is no guarding.     Comments: Abdomen soft, nondistended, nontender to palpation in all quadrants without guarding or peritoneal signs, no CVA tenderness bilaterally  Musculoskeletal:     Cervical back: Neck supple.     Comments: Tenderness to palpation over left low back musculature extending into the left buttock and hip.  Pain made worse with range of motion of the lower extremities, positive straight leg raise on the left.  Skin:    General: Skin is warm and dry.     Capillary Refill: Capillary refill takes less than 2 seconds.  Neurological:     Mental Status: He is alert and oriented to person, place, and time.     Comments: Alert, clear speech, following commands. Moving all extremities without difficulty. Bilateral lower extremities with 5/5 strength in proximal and distal muscle groups and with dorsi and plantar flexion. Sensation intact in bilateral lower extremities. 2+ patellar DTRs bilaterally. Ambulatory with steady gait  Psychiatric:        Behavior: Behavior normal.     ED Results / Procedures / Treatments   Labs (all labs ordered are listed, but only abnormal results are displayed) Labs Reviewed - No data to display  EKG None  Radiology No results found.  Procedures Procedures (including critical care time)  Medications Ordered in ED Medications - No data to display  ED Course  I have reviewed the triage vital signs and the nursing  notes.  Pertinent labs & imaging results that were available during my care of the patient were reviewed by me and considered in my medical decision making (see chart for details).    MDM Rules/Calculators/A&P                          Patient presents with complaint of back pain.  Known hx of herniated disc from 2014. Patient is nontoxic appearing, vitals are WNL aside from HTN. Patient has normal neurologic exam, no point/focal midline tenderness to palpation. Pain is consistently reproducible with straight leg raise on the left. Ambulatory in the ED.  No back pain red flags. No urinary sxs. Suspect sciatic, or radiculopathy from herniated disc. Considered UTI/pyelonephritis, kidney stone, aortic aneurysm/dissection, cauda equina or epidural abscess however these do not feel these diagnoses fit clinical picture at this time. Will treat with percocet, pt is not able to take NSAIDs due to CKD, prednisone and Robaxin, discussed with patient that they are not to drive or operate heavy machinery while taking Robaxin. I discussed treatment plan, need for ortho or PCP follow-up, and return precautions with the patient. Provided opportunity for questions, patient confirmed understanding and is in agreement with plan.   Final Clinical Impression(s) / ED Diagnoses Final diagnoses:  Acute left-sided low back pain with left-sided sciatica    Rx / DC Orders ED Discharge Orders    None       Legrand Rams 10/07/20 2203    Pollyann Savoy, MD 10/08/20 [phone]

## 2020-10-07 NOTE — ED Triage Notes (Signed)
Pt presents with c/o back pain on the left side. Pt reports he has a hx of sciatica and believes that is what is going on. Pt also concerned about a blood clot since the pain runs down to his left leg.

## 2020-10-07 NOTE — Discharge Instructions (Signed)
You were seen here today for Back Pain: Low back pain is discomfort in the lower back that may be due to injuries to muscles and ligaments around the spine. Occasionally, it may be caused by a problem to a part of the spine called a disc. Your back pain should be treated with medicines listed below as well as back exercises and this back pain should get better over the next 2 weeks. Most patients get completely well in 4 weeks. It is important to know however, if you develop severe or worsening pain, low back pain with fever, numbness, weakness or inability to walk or urinate, you should return to the ER immediately.  Please follow up with your doctor this week for a recheck if still having symptoms.  HOME INSTRUCTIONS Self - care:  The application of heat can help soothe the pain.  Maintaining your daily activities, including walking (this is encouraged), as it will help you get better faster than just staying in bed. Do not life, push, pull anything more than 10 pounds for the next week. I am attaching back exercises that you can do at home to help facilitate your recovery.   Back Exercises - I have attached a handout on back exercises that can be done at home to help facilitate your recovery.   Medications are also useful to help with pain control.   Acetaminophen.  This medication is generally safe, and found over the counter. Take as directed for your age. You should not take more than 8 of the extra strength (500mg ) pills a day (max dose is 4000mg  total OVER one day)  Lidocaine Patch: Salon Pas lidocaine patches (blue and silver box) can be purchased over the counter and worn for 12 hours for local pain relief   Percocet: Take 1 tablet every 6 hours as needed for severe pain, this medication can cause drowsiness, do not take while driving or working.  Muscle relaxants:  These medications can help with muscle tightness that is a cause of lower back pain.  Most of these medications can cause  drowsiness, and it is not safe to drive or use dangerous machinery while taking them. They are primarily helpful when taken at night before sleep.  Prednisone - This is an oral steroid.  This medication is best taken with food in the morning.  Please note that this medication can cause anxiety, mood swings, muscle fatigue, increased hunger, weight gain (sodium/fluid retention), poor sleep as well as other symptoms. If you are a diabetic, please monitor your blood sugars at home as this medication can increase your blood sugars. Call your pharmacist if you have any questions.  You will need to follow up with your primary healthcare provider or the Orthopedist in 1-2 weeks for reassessment and persistent symptoms.  Be aware that if you develop new symptoms, such as a fever, leg weakness, difficulty with or loss of control of your urine or bowels, abdominal pain, or more severe pain, you will need to seek medical attention and/or return to the Emergency department. Additional Information:  Your vital signs today were: BP (!) 151/112 (BP Location: Right Arm)   Pulse 97   Temp 98.4 F (36.9 C) (Oral)   Resp 18   SpO2 100%  If your blood pressure (BP) was elevated above 135/85 this visit, please have this repeated by your doctor within one month. ---------------

## 2020-10-10 ENCOUNTER — Ambulatory Visit (HOSPITAL_COMMUNITY): Payer: Self-pay

## 2020-10-23 ENCOUNTER — Other Ambulatory Visit: Payer: Self-pay | Admitting: Family Medicine

## 2020-10-23 ENCOUNTER — Other Ambulatory Visit: Payer: Self-pay | Admitting: Internal Medicine

## 2020-10-23 DIAGNOSIS — I10 Essential (primary) hypertension: Secondary | ICD-10-CM

## 2020-10-23 MED ORDER — AMLODIPINE BESYLATE 10 MG PO TABS: 10.0000 mg | ORAL_TABLET | Freq: Every day | ORAL | 0 refills | Status: DC

## 2020-10-23 MED ORDER — HYDROCHLOROTHIAZIDE 25 MG PO TABS: 25.0000 mg | ORAL_TABLET | Freq: Every day | ORAL | 0 refills | Status: DC

## 2020-10-23 MED ORDER — HYDRALAZINE HCL 25 MG PO TABS: 25.0000 mg | ORAL_TABLET | Freq: Two times a day (BID) | ORAL | 0 refills | Status: DC

## 2020-10-23 MED FILL — AMLODIPINE BESYLATE 10 MG T: 10 | 30 days supply | Qty: 30 | Fill #0

## 2020-10-23 MED FILL — hydrALAZINE HCL 25 MG TABS: 25 | 30 days supply | Qty: 60 | Fill #0

## 2020-10-23 MED FILL — HYDROCHLOROTHIAZIDE 25 MG T: 25 | 30 days supply | Qty: 30 | Fill #0

## 2020-10-23 NOTE — Telephone Encounter (Signed)
Medication Refill - Medication: amlodipine, hydrochlorothiazide, hydralazine   Has the patient contacted their pharmacy? Yes.   Pt states that he is completely out of these medications. Please advise.  (Agent: If no, request that the patient contact the pharmacy for the refill.) (Agent: If yes, when and what did the pharmacy advise?)  Preferred Pharmacy (with phone number or street name):  [facility] - [place], [place] - [address]  [address] [place] [address]  Phone: [phone] Fax: [phone]  Hours: Not open 24 hours     Agent: Please be advised that RX refills may take up to 3 business days. We ask that you follow-up with your pharmacy.

## 2020-10-24 ENCOUNTER — Telehealth: Payer: Self-pay

## 2020-10-24 NOTE — Telephone Encounter (Signed)
Copied from CRM [id number]. Topic: General - Other >> Oct 19, 2020  8:26 AM Jaquita Rector A wrote: Reason for CRM: Patient called in to speak to a nurse that can help him to get a refill on some pain medications was seen in the ED on 10/07/20. Asking for a call back at Ph# [phone]

## 2020-10-29 ENCOUNTER — Other Ambulatory Visit: Payer: Self-pay

## 2020-10-29 ENCOUNTER — Ambulatory Visit (HOSPITAL_COMMUNITY)
Admission: RE | Admit: 2020-10-29 | Discharge: 2020-10-29 | Disposition: A | Discharge status: Home / Self Care | Payer: Self-pay | Source: Ambulatory Visit | Attending: Family Medicine | Admitting: Family Medicine

## 2020-10-29 DIAGNOSIS — R911 Solitary pulmonary nodule: Secondary | ICD-10-CM | POA: Insufficient documentation

## 2020-10-29 DIAGNOSIS — Z720 Tobacco use: Secondary | ICD-10-CM | POA: Insufficient documentation

## 2020-10-29 MED ORDER — OXYCODONE-ACETAMINOPHEN 5-325 MG PO TABS: 1.0000 | ORAL_TABLET | Freq: Four times a day (QID) | ORAL | 0 refills | Status: DC | PRN

## 2020-10-29 NOTE — Telephone Encounter (Addendum)
I will give #20 only.  He will need to be seen in clinic in f/u

## 2020-10-29 NOTE — Addendum Note (Signed)
Addended by: Shan Levans E on: 10/29/2020 12:33 PM   Modules accepted: Orders

## 2020-11-01 ENCOUNTER — Telehealth: Payer: Self-pay

## 2020-11-01 NOTE — Telephone Encounter (Signed)
I have looked at Dr. Laural Benes schedule for tomorrow and she doesn't have any openings fir the afternoon. If he is unable to make appt at 1110am he can turn appt into virtual or he can reschedule

## 2020-11-01 NOTE — Telephone Encounter (Signed)
Copied from CRM [id number]. Topic: Appointment Scheduling - Scheduling Inquiry for Clinic >> Oct 31, 2020 11:47 AM Elliot Gault wrote: Reason for CRM: patient scheduled for Friday 11/02/2020 at 11:10pm, patient has a conflict and would like to know if PCP can work him in on Friday between 1:30pm thru 5pm. Offered patient to turn in office appt to virtual appointment and he stated he has to come into the office. Advised patient he should receive a follow up call in 24 hours   Please advice

## 2020-11-02 ENCOUNTER — Ambulatory Visit: Payer: Self-pay | Attending: Internal Medicine | Admitting: Internal Medicine

## 2020-11-02 ENCOUNTER — Encounter: Payer: Self-pay | Admitting: Internal Medicine

## 2020-11-02 ENCOUNTER — Other Ambulatory Visit: Payer: Self-pay

## 2020-11-02 ENCOUNTER — Other Ambulatory Visit: Payer: Self-pay | Admitting: Internal Medicine

## 2020-11-02 VITALS — BP 150/100 | HR 110 | Temp 98.0°F | Resp 16 | Wt 224.0 lb

## 2020-11-02 DIAGNOSIS — R911 Solitary pulmonary nodule: Secondary | ICD-10-CM

## 2020-11-02 DIAGNOSIS — M5432 Sciatica, left side: Secondary | ICD-10-CM

## 2020-11-02 DIAGNOSIS — Z720 Tobacco use: Secondary | ICD-10-CM

## 2020-11-02 DIAGNOSIS — I10 Essential (primary) hypertension: Secondary | ICD-10-CM

## 2020-11-02 MED ORDER — PREDNISONE 10 MG PO TABS: 10.0000 mg | ORAL_TABLET | Freq: Every day | ORAL | 0 refills | Status: DC

## 2020-11-02 MED ORDER — HYDRALAZINE HCL 50 MG PO TABS: 50.0000 mg | ORAL_TABLET | Freq: Two times a day (BID) | ORAL | 4 refills | Status: DC

## 2020-11-02 MED ORDER — METHOCARBAMOL 500 MG PO TABS: 500.0000 mg | ORAL_TABLET | Freq: Two times a day (BID) | ORAL | 2 refills | Status: DC

## 2020-11-02 MED FILL — hydrALAZINE HCL 50 MG TABS: 50 | 30 days supply | Qty: 60 | Fill #0

## 2020-11-02 NOTE — Progress Notes (Signed)
Patient ID: Anthony Estrada, male    DOB: 07/23/1976  MRN: [id number]  CC: ER f/u for lower back pain  Subjective: Anthony Estrada is a 44 y.o. male who presents for ER f/u.  PCP was Dr. Jillyn Hidden who has left the practice. His concerns today include: Patient with history of HTN, tobacco dependence, CKD 3, solitary lung nodule  Patient seen in the emergency room about a month ago complaining of pain in the left lower back radiating into the left buttock and leg that it started about 2 weeks prior.  Diagnosed with sciatica.  Patient states he was given prednisone, Percocet and a muscle relaxant that worked well for him in combination.   Gives history of slip disks on the left side back in 2015 while he was incarcerated.    Had MRI that showed herniated disc on the left side.  Saw the spine specialist at [facility] and they had recommended that surgery but they could not guarantee a good outcome so he declined the surgery and did physical therapy.  Did well with physical therapy.    Current episode of left lower back pain he states started a few days prior to ER visit.  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  -Pain is in the left lower back and radiates down the buttock into the left leg.  No numbness in the leg.  Numbness present in the left fifth toe.  He describes the pain as a pulling.  The pain is intermittent and worse when he lays flat.  He has had no issues with his bowel or bladder function.  He feels the left leg is little weaker.  He has avoided any lifting.  He has been walking and doing stretching exercises as he was taught by physical therapy when he was incarcerated and had this issue.  Rates pain as 6-7 out of 10 when it comes on. Currently not working. Requesting refill on Percocet.  Allergic to codeine.  Had called in the end of last month requesting refill from what he was given in the  emergency room.  Dr. Delford Field had sent a prescription to his [facility] pharmacy for 20 tablets but requested that he be seen in the clinic.  HTN:  Compliant with meds and took them already for today.   No device to check BP  History of left upper lobe lung nodule.  Dr. Jillyn Hidden had referred him for CAT scan of the chest.  The 1.6 cm nodule present in the left upper lobe appears stable in size when compared to previous study done a year earlier..  Radiologist said this is reassuring and may suggest a benign etiology such as a hamartoma.  However he recommend follow-up imaging again in 1 year.  Patient smokes 4-5 black and mild cigarettes a day.  He is smoked for 20 years.  Not ready to quit.. Patient Active Problem List   Diagnosis Date Noted  . Solitary pulmonary nodule 04/19/2020  . Stage 3 chronic kidney disease (HCC) 04/19/2020  . Chest pain 11/21/2019  . Hypertension 12/15/2018     Current Outpatient Medications on File Prior to Visit  Medication Sig Dispense Refill  . amLODipine (NORVASC) 10 MG tablet Take 1 tablet (10 mg total) by mouth daily. 30 tablet 0  . aspirin EC 325 MG tablet Take 1 tablet (325 mg total) by mouth daily. 14 tablet  0  . hydrochlorothiazide (HYDRODIURIL) 25 MG tablet Take 1 tablet (25 mg total) by mouth daily. 30 tablet 0  . oxyCODONE-acetaminophen (PERCOCET/ROXICET) 5-325 MG tablet Take 1 tablet by mouth every 6 (six) hours as needed for severe pain. (Patient not taking: Reported on 11/02/2020) 20 tablet 0   No current facility-administered medications on file prior to visit.    Allergies  Allergen Reactions  . Codeine Itching  . Shellfish Allergy Swelling    Social History   Socioeconomic History  . Marital status: Single    Spouse name: Not on file  . Number of children: Not on file  . Years of education: Not on file  . Highest education level: Not on file  Occupational History  . Not on file  Tobacco Use  . Smoking status: Current Every Day Smoker     Packs/day: 1.00    Types: Cigars  . Smokeless tobacco: Never Used  Vaping Use  . Vaping Use: Never used  Substance and Sexual Activity  . Alcohol use: Never  . Drug use: Not on file  . Sexual activity: Yes  Other Topics Concern  . Not on file  Social History Narrative  . Not on file   Social Determinants of Health   Financial Resource Strain:   . Difficulty of Paying Living Expenses: Not on file  Food Insecurity:   . Worried About Programme researcher, broadcasting/film/video in the Last Year: Not on file  . Ran Out of Food in the Last Year: Not on file  Transportation Needs:   . Lack of Transportation (Medical): Not on file  . Lack of Transportation (Non-Medical): Not on file  Physical Activity:   . Days of Exercise per Week: Not on file  . Minutes of Exercise per Session: Not on file  Stress:   . Feeling of Stress : Not on file  Social Connections:   . Frequency of Communication with Friends and Family: Not on file  . Frequency of Social Gatherings with Friends and Family: Not on file  . Attends Religious Services: Not on file  . Active Member of Clubs or Organizations: Not on file  . Attends Banker Meetings: Not on file  . Marital Status: Not on file  Intimate Partner Violence:   . Fear of Current or Ex-Partner: Not on file  . Emotionally Abused: Not on file  . Physically Abused: Not on file  . Sexually Abused: Not on file    Family History  Problem Relation Age of Onset  . Hypertension Mother   . Diabetes Mother   . Hypertension Father   . Diabetes Father     Past Surgical History:  Procedure Laterality Date  . no past surgery      ROS: Review of Systems Negative except as stated above  PHYSICAL EXAM: BP (!) 150/100   Pulse (!) 110   Temp 98 F (36.7 C)   Resp 16   Wt 224 lb (101.6 kg)   SpO2 98%   BMI 36.49 kg/m   Physical Exam  General appearance - alert, well appearing, and in no distress Mental status - normal mood, behavior, speech, dress, motor  activity, and thought processes Chest - clear to auscultation, no wheezes, rales or rhonchi, symmetric air entry Heart - normal rate, regular rhythm, normal S1, S2, no murmurs, rubs, clicks or gallops Neurological -gait appears normal.  Power in the right lower extremity 5/5 proximally and distally.  In the left lower extremity 4+/5 proximally and  5/5 distally.  He appears to have some discomfort when testing his strength. Musculoskeletal -mild to moderate tenderness on palpation of the left lumbar paraspinal muscle.  Patient declined to lay down for me to do straight leg raise test.  CMP Latest Ref Rng & Units 09/13/2020 08/29/2020 04/19/2020  Glucose 65 - 99 mg/dL 85 620(B) 99  BUN 6 - 24 mg/dL 21 18 16   Creatinine 0.76 - 1.27 mg/dL ) 5.59(R) 4.16(L)  Sodium 134 - 144 mmol/L 139 137 143  Potassium 3.5 - 5.2 mmol/L 3.9 3.6 4.2  Chloride 96 - 106 mmol/L 103 105 104  CO2 20 - 29 mmol/L 22 24 22   Calcium 8.7 - 10.2 mg/dL 9.2 8.45(X) 9.1  Total Protein 6.0 - 8.3 g/dL - - -  Total Bilirubin 0.3 - 1.2 mg/dL - - -  Alkaline Phos 39 - 117 U/L - - -  AST 0 - 37 U/L - - -  ALT 0 - 53 U/L - - -   Lipid Panel     Component Value Date/Time   CHOL 154 09/13/2020 0928   TRIG 59 09/13/2020 0928   HDL 58 09/13/2020 0928   CHOLHDL 2.7 09/13/2020 0928   LDLCALC 84 09/13/2020 0928    CBC    Component Value Date/Time   WBC 5.5 08/29/2020 0232   RBC 4.01 (L) 08/29/2020 0232   HGB 12.1 (L) 08/29/2020 0232   HGB 13.6 04/19/2020 1022   HCT 37.1 (L) 08/29/2020 0232   HCT 41.8 04/19/2020 1022   PLT 184 08/29/2020 0232   PLT 209 04/19/2020 1022   MCV 92.5 08/29/2020 0232   MCV 91 04/19/2020 1022   MCH 30.2 08/29/2020 0232   MCHC 32.6 08/29/2020 0232   RDW 14.1 08/29/2020 0232   RDW 13.1 04/19/2020 1022   LYMPHSABS 2.4 08/29/2020 0232   MONOABS 0.6 08/29/2020 0232   EOSABS 0.2 08/29/2020 0232   BASOSABS 0.0 08/29/2020 0232    ASSESSMENT AND PLAN:  1. Sciatica of left side -Advised  patient to avoid any heavy lifting or excessive bending.  Recommend use of a heating pad.  Informed him that Dr. 08/31/2020 had sent a prescription to [facility] pharmacy the end of last month for 20 Percocet pills.  Informed that he should go ahead and fill that prescription.  Advised that I will not be doing subsequent refills as it is not our policy here to prescribe heavy narcotic meds that include Percocet, Morphine or Vicodin especially on a long-term basis.  Instead I have recommended gabapentin and perhaps lidocaine patches.  Patient declined stating that he wants what works for him.  He understands that I will not be refilling that prescription. -We will do a short course of prednisone again and refill on the muscle relaxant. -We discussed getting an MRI. Advised to apply for the orange card/cone discount so that we can refer to a spine specialist and perhaps physical therapy once we get the results of the MRI.  Patient tells me that he completed form online for the orange card about 2 months ago but has not heard anything from it.  I will have my CMA check with financial counselor here to see if it was received. - MR Lumbar Spine Wo Contrast; Future - methocarbamol (ROBAXIN) 500 MG tablet; Take 1 tablet (500 mg total) by mouth 2 (two) times daily.  Dispense: 40 tablet; Refill: 2 - predniSONE (DELTASONE) 10 MG tablet; Take 1 tablet (10 mg total) by mouth daily with breakfast.  Dispense:  5 tablet; Refill: 0  2. Essential hypertension Not at goal.  Recommend increasing hydralazine to 50 mg twice a day. - hydrALAZINE (APRESOLINE) 50 MG tablet; Take 1 tablet (50 mg total) by mouth in the morning and at bedtime.  Dispense: 60 tablet; Refill: 4  3.  Solitary lung nodule Advised patient that even though the nodule is stable in size, we do need to do follow-up again in 1 year.  He is high risk given that he is a smoker and is smoked for 20 years.  We will have him see a pulmonologist just to make sure that it  is okay to continue following this with imaging.  4.  Tobacco dependence Discussed health risks associated with smoking.  The 1.6 cm nodule even though it is stable in size is still concerning given that he is a smoker.  Encouraged him to quit.  Patient not ready to give a trial of quitting.  Less than 5 minutes spent on counseling. Patient was given the opportunity to ask questions.  Patient verbalized understanding of the plan and was able to repeat key elements of the plan.   Orders Placed This Encounter  Procedures  . MR Lumbar Spine Wo Contrast     Requested Prescriptions   Signed Prescriptions Disp Refills  . methocarbamol (ROBAXIN) 500 MG tablet 40 tablet 2    Sig: Take 1 tablet (500 mg total) by mouth 2 (two) times daily.  . predniSONE (DELTASONE) 10 MG tablet 5 tablet 0    Sig: Take 1 tablet (10 mg total) by mouth daily with breakfast.  . hydrALAZINE (APRESOLINE) 50 MG tablet 60 tablet 4    Sig: Take 1 tablet (50 mg total) by mouth in the morning and at bedtime.    No follow-ups on file.  Jonah Blueeborah Ermina Oberman, MD, FACP

## 2020-11-02 NOTE — Patient Instructions (Signed)
I have sent prescription to [facility] for a muscle relaxant called Robaxin and prednisone. We have ordered an MRI of your back. Try to get approved for the orange card/cone discount so that once we get the results of the MRI if we need to refer you to a specialist it will be covered or partially covered. Dr. Delford Field had sent a prescription to [facility] on the 29th of last month for Percocet No. 20 tablets. As I have discussed with you, it is not our policy here at this clinic to prescribe Percocet, Vicodin or morphine.

## 2020-12-07 ENCOUNTER — Telehealth: Payer: Self-pay | Admitting: Critical Care Medicine

## 2020-12-07 ENCOUNTER — Telehealth: Payer: Self-pay

## 2020-12-07 MED FILL — HYDROCHLOROTHIAZIDE 25 MG T: 25 | 30 days supply | Qty: 30 | Fill #1

## 2020-12-07 MED FILL — hydrALAZINE HCL 50 MG TABS: 50 | 30 days supply | Qty: 60 | Fill #0

## 2020-12-07 MED FILL — AMLODIPINE BESYLATE 10 MG T: 10 | 30 days supply | Qty: 30 | Fill #1

## 2020-12-07 NOTE — Telephone Encounter (Signed)
Error

## 2020-12-07 NOTE — Telephone Encounter (Signed)
Patient name and DOB verified.   Informed patient that this nurse request more information regarding nature of call before sending request to Dr. Delford Field. Patient did not wish to disclose concerns. Informed patient that Dr. Delford Field would return to office on Monday and may be able to call him next week.  Informed that request would be routed to Dr. Delford Field.

## 2020-12-07 NOTE — Telephone Encounter (Signed)
Copied from CRM [id number]. Topic: General - Other >> Dec 07, 2020  8:33 AM Anthony Estrada wrote: Reason for CRM: Pt called and is requesting to have Dr. Delford Field give him a call back to go over his BP and overall health. Please advise.

## 2020-12-10 NOTE — Telephone Encounter (Signed)
I have not seen him yet.  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  I tried to call him, no answer.  Pls let him know I will discuss his health concerns and BP with him next week on his 1/18 appt

## 2020-12-18 ENCOUNTER — Ambulatory Visit: Payer: Self-pay | Admitting: Critical Care Medicine

## 2020-12-18 NOTE — Progress Notes (Deleted)
   Subjective:    Patient ID: Anthony Estrada, male    DOB: Apr 29, 1976, 45 y.o.   MRN: [id number]  45 y.o.M   Here to est PCP Hx HTN, CKD stage 3, pulmonary nodule  12/18/2020      Review of Systems     Objective:   Physical Exam        Assessment & Plan:

## 2021-01-13 ENCOUNTER — Other Ambulatory Visit: Payer: Self-pay | Admitting: Family Medicine

## 2021-01-13 DIAGNOSIS — I10 Essential (primary) hypertension: Secondary | ICD-10-CM

## 2021-01-13 MED ORDER — AMLODIPINE BESYLATE 10 MG PO TABS: 10.0000 mg | ORAL_TABLET | Freq: Every day | ORAL | 0 refills | Status: DC

## 2021-01-13 MED ORDER — HYDROCHLOROTHIAZIDE 25 MG PO TABS: 25.0000 mg | ORAL_TABLET | Freq: Every day | ORAL | 0 refills | Status: DC

## 2021-02-11 ENCOUNTER — Other Ambulatory Visit: Payer: Self-pay | Admitting: Family Medicine

## 2021-02-11 ENCOUNTER — Ambulatory Visit: Payer: Self-pay | Admitting: *Deleted

## 2021-02-11 DIAGNOSIS — I10 Essential (primary) hypertension: Secondary | ICD-10-CM

## 2021-02-11 MED ORDER — AMLODIPINE BESYLATE 10 MG PO TABS: 10.0000 mg | ORAL_TABLET | Freq: Every day | ORAL | 0 refills | Status: DC

## 2021-02-11 MED ORDER — HYDRALAZINE HCL 50 MG PO TABS: 50.0000 mg | ORAL_TABLET | Freq: Two times a day (BID) | ORAL | 0 refills | Status: DC

## 2021-02-11 MED ORDER — HYDROCHLOROTHIAZIDE 25 MG PO TABS: 25.0000 mg | ORAL_TABLET | Freq: Every day | ORAL | 0 refills | Status: DC

## 2021-02-11 NOTE — Telephone Encounter (Signed)
Patient is calling to request an appointment for tingling in fourth and fifth digits of R hand and now elbow pain- he has had these symptoms for 1 month and they are not improving. 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 Advised I would send message to office for review and scheduling options. Reason for Disposition . [1] Numbness or tingling in one or both hands AND [2] is a chronic symptom (recurrent or ongoing AND present > 4 weeks)  Answer Assessment - Initial Assessment Questions 1. SYMPTOM: "What is the main symptom you are concerned about?" (e.g., weakness, numbness)     tingling in 4 and 5 digits- R hand, elbow pain 2. ONSET: "When did this start?" (minutes, hours, days; while sleeping)     1 month 3. LAST NORMAL: "When was the last time you were normal (no symptoms)?"     Over 1 month 4. PATTERN "Does this come and go, or has it been constant since it started?"  "Is it present now?"     Constant- yes 5. CARDIAC SYMPTOMS: "Have you had any of the following symptoms: chest pain, difficulty breathing, palpitations?"     no 6. NEUROLOGIC SYMPTOMS: "Have you had any of the following symptoms: headache, dizziness, vision loss, double vision, changes in speech, unsteady on your feet?"     no 7. OTHER SYMPTOMS: "Do you have any other symptoms?"     No other symptoms 8. PREGNANCY: "Is there any chance you are pregnant?" "When was your last menstrual period?"     n/a  Protocols used: NEUROLOGIC DEFICIT-A-AH

## 2021-02-11 NOTE — Telephone Encounter (Signed)
Medication: hydrALAZINE (APRESOLINE) 50 MG tablet [[id number]] , hydrochlorothiazide (HYDRODIURIL) 25 MG tablet [[id number]] , amLODipine (NORVASC) 10 MG tablet [[id number]] ,   Has the patient contacted their pharmacy? YES  (Agent: If yes, when and what did the pharmacy advise?)  Preferred Pharmacy (with phone number or street name): [facility] - [place], [place] - [address] [place] [address] Phone: [phone] Fax: [phone] Hours: Not open 24 hours    Agent: Please be advised that RX refills may take up to 3 business days. We ask that you follow-up with your pharmacy.

## 2021-02-11 NOTE — Telephone Encounter (Signed)
Pt is calling for advise he is having Tinging in his R Pinky finger and the finger next to it for 1 month. Called NT - advised to place a clinical call.

## 2021-02-11 NOTE — Telephone Encounter (Signed)
Returned pt call. Made pt aware that I can schedule him a virtual appt for Wednesday pt declined. Pt asked if he can be fitted in made pt aware that there is no availability. Also made pt aware that I can refer him to the mobile bus which will be located at  [facility] [address] [phone] Pt states that's where he is located. Provided pt with time. Pt states he will go to the mobile bus for evaluation. Pt then asked bout his medications be sent to pharmacy. Made pt aware that Amlodipine, Hydralazine and HCTZ was sent to the pharmacy. Pt states he will be coming by to get medications. Pt didn't have any other questions or concerns

## 2021-02-12 ENCOUNTER — Ambulatory Visit: Payer: Self-pay | Admitting: Physician Assistant

## 2021-02-12 ENCOUNTER — Other Ambulatory Visit: Payer: Self-pay

## 2021-02-12 ENCOUNTER — Other Ambulatory Visit: Payer: Self-pay | Admitting: Physician Assistant

## 2021-02-12 VITALS — BP 154/114 | HR 88 | Temp 98.2°F | Resp 18 | Ht 64.0 in | Wt 224.0 lb

## 2021-02-12 DIAGNOSIS — I10 Essential (primary) hypertension: Secondary | ICD-10-CM

## 2021-02-12 DIAGNOSIS — M778 Other enthesopathies, not elsewhere classified: Secondary | ICD-10-CM | POA: Insufficient documentation

## 2021-02-12 MED ORDER — IBUPROFEN 600 MG PO TABS: 600.0000 mg | ORAL_TABLET | Freq: Three times a day (TID) | ORAL | 0 refills | Status: DC | PRN

## 2021-02-12 NOTE — Progress Notes (Signed)
Patient took BP medication at 6:15 am this morning. Patinet has not eaten today. Patient complains of right hand ring and pinky finger tingling for the past month. Patient has had elbow intermittent pain for the past week on the right side. Patient request refill on aspirin.

## 2021-02-12 NOTE — Progress Notes (Signed)
Established Patient Office Visit  Subjective:  Patient ID: Anthony Estrada, male    DOB: [birth year]  Age: 45 y.o. MRN: [id number]  CC: No chief complaint on file.   HPI Anthony Estrada reports that he has been having pain in his right pinky and ring finger.  Reports that this has been present for the past week, but does endorse that he has had this occur intermediately for the past few years.  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  Reports that he has been working on improving his strength in his right arm, but states that this is not offering relief.  Reports that he did take his blood pressure medication today, did smoke a black and mild prior to his office visit, does not check blood pressure at home.  Denies any hypertensive symptoms.   Past Medical History:  Diagnosis Date  . Hypertension     Past Surgical History:  Procedure Laterality Date  . no past surgery      Family History  Problem Relation Age of Onset  . Hypertension Mother   . Diabetes Mother   . Hypertension Father   . Diabetes Father     Social History   Socioeconomic History  . Marital status: Single    Spouse name: Not on file  . Number of children: Not on file  . Years of education: Not on file  . Highest education level: Not on file  Occupational History  . Not on file  Tobacco Use  . Smoking status: Current Every Day Smoker    Packs/day: 1.00    Types: Cigars  . Smokeless tobacco: Never Used  Vaping Use  . Vaping Use: Never used  Substance and Sexual Activity  . Alcohol use: Never  . Drug use: Not on file  . Sexual activity: Yes  Other Topics Concern  . Not on file  Social History Narrative  . Not on file   Social Determinants of Health   Financial Resource Strain: Not on file  Food Insecurity: Not on file  Transportation Needs: Not on file  Physical Activity: Not on file  Stress: Not on file  Social  Connections: Not on file  Intimate Partner Violence: Not on file    Outpatient Medications Prior to Visit  Medication Sig Dispense Refill  . amLODipine (NORVASC) 10 MG tablet Take 1 tablet (10 mg total) by mouth daily. 30 tablet 0  . aspirin EC 325 MG tablet Take 1 tablet (325 mg total) by mouth daily. 14 tablet 0  . hydrALAZINE (APRESOLINE) 50 MG tablet Take 1 tablet (50 mg total) by mouth in the morning and at bedtime. 60 tablet 0  . hydrochlorothiazide (HYDRODIURIL) 25 MG tablet Take 1 tablet (25 mg total) by mouth daily. 30 tablet 0  . methocarbamol (ROBAXIN) 500 MG tablet Take 1 tablet (500 mg total) by mouth 2 (two) times daily. 40 tablet 2  . oxyCODONE-acetaminophen (PERCOCET/ROXICET) 5-325 MG tablet Take 1 tablet by mouth every 6 (six) hours as needed for severe pain. (Patient not taking: Reported on 11/02/2020) 20 tablet 0  . predniSONE (DELTASONE) 10 MG tablet Take 1 tablet (10 mg total) by mouth daily with breakfast. 5 tablet 0   No facility-administered medications prior to visit.    Allergies  Allergen Reactions  . Codeine Itching  . Shellfish Allergy Swelling    ROS Review of Systems  Constitutional: Negative for chills and  fever.  HENT: Negative.   Eyes: Negative.   Respiratory: Negative for shortness of breath.   Cardiovascular: Negative for chest pain.  Gastrointestinal: Negative.   Endocrine: Negative.   Genitourinary: Negative.   Musculoskeletal: Positive for myalgias. Negative for joint swelling.  Skin: Negative.   Allergic/Immunologic: Negative.   Neurological: Negative for headaches.  Hematological: Negative.   Psychiatric/Behavioral: Negative.       Objective:    Physical Exam Vitals and nursing note reviewed.  Constitutional:      Appearance: Normal appearance.  HENT:     Head: Normocephalic and atraumatic.     Right Ear: External ear normal.     Left Ear: External ear normal.     Nose: Nose normal.     Mouth/Throat:     Mouth: Mucous  membranes are moist.     Pharynx: Oropharynx is clear.  Cardiovascular:     Rate and Rhythm: Normal rate and regular rhythm.     Pulses: Normal pulses.     Heart sounds: Normal heart sounds.  Pulmonary:     Effort: Pulmonary effort is normal.     Breath sounds: Normal breath sounds.  Musculoskeletal:     Left shoulder: Normal.     Right upper arm: Normal.     Left upper arm: Normal.     Right elbow: No swelling. Normal range of motion. No tenderness.     Left elbow: No swelling. Normal range of motion. No tenderness.     Right wrist: No swelling.     Left wrist: Normal.     Right hand: No swelling.     Left hand: Normal.     Cervical back: Normal range of motion and neck supple.  Skin:    General: Skin is warm and dry.  Neurological:     General: No focal deficit present.     Mental Status: He is alert and oriented to person, place, and time. Mental status is at baseline.  Psychiatric:        Mood and Affect: Mood normal.        Behavior: Behavior normal.        Thought Content: Thought content normal.        Judgment: Judgment normal.     BP (!) 154/114 (BP Location: Left Arm, Patient Position: Sitting, Cuff Size: Large)   Pulse 88   Temp 98.2 F (36.8 C) (Oral)   Resp 18   Ht 5\' 4"  (1.626 m)   Wt 224 lb (101.6 kg)   SpO2 99%   BMI 38.45 kg/m  Wt Readings from Last 3 Encounters:  02/12/21 224 lb (101.6 kg)  11/02/20 224 lb (101.6 kg)  09/13/20 213 lb 3.2 oz (96.7 kg)     Health Maintenance Due  Topic Date Due  . TETANUS/TDAP  Never done  . COLONOSCOPY (Pts 45-47yrs Insurance coverage will need to be confirmed)  Never done    There are no preventive care reminders to display for this patient.  No results found for: TSH Lab Results  Component Value Date   WBC 5.5 08/29/2020   HGB 12.1 (L) 08/29/2020   HCT 37.1 (L) 08/29/2020   MCV 92.5 08/29/2020   PLT 184 08/29/2020   Lab Results  Component Value Date   NA 139 09/13/2020   K 3.9 09/13/2020   CO2  22 09/13/2020   GLUCOSE 85 09/13/2020   BUN 21 09/13/2020   CREATININE 1.56 (H) 09/13/2020   BILITOT 0.6 08/16/2010   ALKPHOS  55 08/16/2010   AST 42 (H) 08/16/2010   ALT 256 (H) 08/16/2010   PROT 7.1 08/16/2010   ALBUMIN 4.2 04/19/2020   CALCIUM 9.2 09/13/2020   ANIONGAP 8 08/29/2020   Lab Results  Component Value Date   CHOL 154 09/13/2020   Lab Results  Component Value Date   HDL 58 09/13/2020   Lab Results  Component Value Date   LDLCALC 84 09/13/2020   Lab Results  Component Value Date   TRIG 59 09/13/2020   Lab Results  Component Value Date   CHOLHDL 2.7 09/13/2020   Lab Results  Component Value Date   HGBA1C 5.3 09/13/2020      Assessment & Plan:   Problem List Items Addressed This Visit   None   Visit Diagnoses    Tendinitis of right elbow    -  Primary   Relevant Medications   ibuprofen (ADVIL) 600 MG tablet   Elevated blood pressure reading in office with diagnosis of hypertension        1. Tendinitis of right elbow Trial ibuprofen, encouraged bracing, patient education given on rest, ice.  Red flags given for prompt reevaluation. - ibuprofen (ADVIL) 600 MG tablet; Take 1 tablet (600 mg total) by mouth every 8 (eight) hours as needed.  Dispense: 30 tablet; Refill: 0  2. Elevated blood pressure reading in office with diagnosis of hypertension Encourage patient to check blood pressure on a daily basis, keep a written log and have available for all office visits.  Patient encouraged to schedule follow-up appointment with PCP or return to mobile unit if blood pressures are not within normal range.   I have reviewed the patient's medical history (PMH, PSH, Social History, Family History, Medications, and allergies) , and have been updated if relevant. I spent 20 minutes reviewing chart and  face to face time with patient.     Meds ordered this encounter  Medications  . ibuprofen (ADVIL) 600 MG tablet    Sig: Take 1 tablet (600 mg total) by mouth  every 8 (eight) hours as needed.    Dispense:  30 tablet    Refill:  0    Order Specific Question:   Supervising Provider    Answer:   Storm Frisk [[id number]]    Follow-up: Return if symptoms worsen or fail to improve.    Kasandra Knudsen Mayers, PA-C

## 2021-02-12 NOTE — Patient Instructions (Signed)
I encourage you to use the ibuprofen to help with the inflammation, bracing as we discussed.  I encourage you to check your blood pressure on a daily basis, keep a written log and have available for all office visits.  Please feel free to return to the mobile unit in the next couple of weeks if your blood pressures are not within normal range.  Roney Jaffe, PA-C Physician Assistant [facility] [email or web address]    Tendinitis  Tendinitis is inflammation of a tendon. A tendon is a strong cord of tissue that connects muscle to bone. Tendinitis can affect any tendon, but it most commonly affects the:  Shoulder tendon (rotator cuff).  Ankle tendon (Achilles tendon).  Elbow tendon (triceps tendon).  Tendons in the wrist. What are the causes? This condition may be caused by:  Overusing a tendon or muscle. This is common.  Age-related wear and tear.  Injury.  Inflammatory conditions, such as arthritis.  Certain medicines. What increases the risk? You are more likely to develop this condition if you do activities that involve the same movements over and over again (repetitive motions). What are the signs or symptoms? Symptoms of this condition may include:  Pain.  Tenderness.  Mild swelling.  Decreased range of motion. How is this diagnosed? This condition is diagnosed with a physical exam. You may also have tests, such as:  Ultrasound. This uses sound waves to make an image of the inside of your body in the affected area.  MRI. How is this treated? This condition may be treated by resting, icing, applying pressure (compression), and raising (elevating) the affected area above the level of your heart. This is known as RICE therapy. Treatment may also include:  Medicines to help reduce inflammation or to help reduce pain.  Exercises or physical therapy to strengthen and stretch the tendon.  A brace or  splint.  Surgery. This is rarely needed. Follow these instructions at home: If you have a splint or brace:  Wear the splint or brace as told by your health care provider. Remove it only as told by your health care provider.  Loosen the splint or brace if your fingers or toes tingle, become numb, or turn cold and blue.  Keep the splint or brace clean.  If the splint or brace is not waterproof: ? Do not let it get wet. ? Cover it with a watertight covering when you take a bath or shower. Managing pain, stiffness, and swelling  If directed, put ice on the affected area. ? If you have a removable splint or brace, remove it as told by your health care provider. ? Put ice in a plastic bag. ? Place a towel between your skin and the bag. ? Leave the ice on for 20 minutes, 2-3 times a day.  Move the fingers or toes of the affected limb often, if this applies. This can help to prevent stiffness and lessen swelling.  If directed, raise (elevate) the affected area above the level of your heart while you are sitting or lying down.   If directed, apply heat to the affected area before you exercise. Use the heat source that your health care provider recommends, such as a moist heat pack or a heating pad. ? Place a towel between your skin and the heat source. ? Leave the heat on for 20-30 minutes. ? Remove the heat if your skin turns bright red. This is especially important if you are unable to feel pain, heat,  or cold. You may have a greater risk of getting burned.      Driving  Do not drive or use heavy machinery while taking prescription pain medicine.  Ask your health care provider when it is safe to drive if you have a splint or brace on any part of your arm or leg. Activity  Rest the affected area as told by your health care provider.  Return to your normal activities as told by your health care provider. Ask your health care provider what activities are safe for you.  Avoid using  the affected area while you are experiencing symptoms of tendinitis.  Do exercises as told by your health care provider. General instructions  If you have a splint, do not put pressure on any part of the splint until it is fully hardened. This may take several hours.  Wear an elastic bandage or compression wrap only as told by your health care provider.  Take over-the-counter and prescription medicines only as told by your health care provider.  Keep all follow-up visits as told by your health care provider. This is important. Contact a health care provider if:  Your symptoms do not improve.  You develop new, unexplained problems, such as numbness in your hands. Summary  Tendinitis is inflammation of a tendon.  You are more likely to develop this condition if you do activities that involve the same movements over and over again.  This condition may be treated by resting, icing, applying pressure (compression), and elevating the area above the level of your heart. This is known as RICE therapy.  Avoid using the affected area while you are experiencing symptoms of tendinitis. This information is not intended to replace advice given to you by your health care provider. Make sure you discuss any questions you have with your health care provider. Document Revised: 05/25/2018 Document Reviewed: 04/07/2018 Elsevier Patient Education  2021 Elsevier Inc.   How to Take Your Blood Pressure Blood pressure is a measurement of how strongly your blood is pressing against the walls of your arteries. Arteries are blood vessels that carry blood from your heart throughout your body. Your health care provider takes your blood pressure at each office visit. You can also take your own blood pressure at home with a blood pressure monitor. You may need to take your own blood pressure to:  Confirm a diagnosis of high blood pressure (hypertension).  Monitor your blood pressure over time.  Make sure  your blood pressure medicine is working. Supplies needed:  Blood pressure monitor.  Dining room chair to sit in.  Table or desk.  Small notebook and pencil or pen. How to prepare To get the most accurate reading, avoid the following for 30 minutes before you check your blood pressure:  Drinking caffeine.  Drinking alcohol.  Eating.  Smoking.  Exercising. Five minutes before you check your blood pressure:  Use the bathroom and urinate so that you have an empty bladder.  Sit quietly in a dining room chair. Do not sit in a soft couch or an armchair. Do not talk. How to take your blood pressure To check your blood pressure, follow the instructions in the manual that came with your blood pressure monitor. If you have a digital blood pressure monitor, the instructions may be as follows: 1. Sit up straight in a chair. 2. Place your feet on the floor. Do not cross your ankles or legs. 3. Rest your left arm at the level of your heart on  a table or desk or on the arm of a chair. 4. Pull up your shirt sleeve. 5. Wrap the blood pressure cuff around the upper part of your left arm, 1 inch (2.5 cm) above your elbow. It is best to wrap the cuff around bare skin. 6. Fit the cuff snugly around your arm. You should be able to place only one finger between the cuff and your arm. 7. Position the cord so that it rests in the bend of your elbow. 8. Press the power button. 9. Sit quietly while the cuff inflates and deflates. 10. Read the digital reading on the monitor screen and write the numbers down (record them) in a notebook. 11. Wait 2-3 minutes, then repeat the steps, starting at step 1.   What does my blood pressure reading mean? A blood pressure reading consists of a higher number over a lower number. Ideally, your blood pressure should be below 120/80. The first ("top") number is called the systolic pressure. It is a measure of the pressure in your arteries as your heart beats. The second  ("bottom") number is called the diastolic pressure. It is a measure of the pressure in your arteries as the heart relaxes. Blood pressure is classified into five stages. The following are the stages for adults who do not have a short-term serious illness or a chronic condition. Systolic pressure and diastolic pressure are measured in a unit called mm Hg (millimeters of mercury).  Normal  Systolic pressure: below 120.  Diastolic pressure: below 80. Elevated  Systolic pressure: 120-129.  Diastolic pressure: below 80. Hypertension stage 1  Systolic pressure: 130-139.  Diastolic pressure: 80-89. Hypertension stage 2  Systolic pressure: 140 or above.  Diastolic pressure: 90 or above. You can have elevated blood pressure or hypertension even if only the systolic or only the diastolic number in your reading is higher than normal. Follow these instructions at home:  Check your blood pressure as often as recommended by your health care provider.  Check your blood pressure at the same time every day.  Take your monitor to the next appointment with your health care provider to make sure that: ? You are using it correctly. ? It provides accurate readings.  Be sure you understand what your goal blood pressure numbers are.  Tell your health care provider if you are having any side effects from blood pressure medicine.  Keep all follow-up visits as told by your health care provider. This is important. General tips  Your health care provider can suggest a reliable monitor that will meet your needs. There are several types of home blood pressure monitors.  Choose a monitor that has an arm cuff. Do not choose a monitor that measures your blood pressure from your wrist or finger.  Choose a cuff that wraps snugly around your upper arm. You should be able to fit only one finger between your arm and the cuff.  You can buy a blood pressure monitor at most drugstores or online. Where to find  more information American Heart Association: www.heart.org Contact a health care provider if:  Your blood pressure is consistently high. Get help right away if:  Your systolic blood pressure is higher than 180.  Your diastolic blood pressure is higher than 120. Summary  Blood pressure is a measurement of how strongly your blood is pressing against the walls of your arteries.  A blood pressure reading consists of a higher number over a lower number. Ideally, your blood pressure should be below  120/80.  Check your blood pressure at the same time every day.  Avoid caffeine, alcohol, smoking, and exercise for 30 minutes prior to checking your blood pressure. These agents can affect the accuracy of the blood pressure reading. This information is not intended to replace advice given to you by your health care provider. Make sure you discuss any questions you have with your health care provider. Document Revised: 11/11/2019 Document Reviewed: 11/11/2019 Elsevier Patient Education  2021 [employer].

## 2021-03-02 ENCOUNTER — Other Ambulatory Visit: Payer: Self-pay

## 2021-03-29 ENCOUNTER — Emergency Department (HOSPITAL_BASED_OUTPATIENT_CLINIC_OR_DEPARTMENT_OTHER): Payer: Self-pay

## 2021-03-29 ENCOUNTER — Other Ambulatory Visit: Payer: Self-pay

## 2021-03-29 ENCOUNTER — Encounter (HOSPITAL_BASED_OUTPATIENT_CLINIC_OR_DEPARTMENT_OTHER): Payer: Self-pay | Admitting: Emergency Medicine

## 2021-03-29 ENCOUNTER — Inpatient Hospital Stay (HOSPITAL_BASED_OUTPATIENT_CLINIC_OR_DEPARTMENT_OTHER)
Admission: EM | Admit: 2021-03-29 | Discharge: 2021-04-01 | DRG: 917 | Disposition: A | Discharge status: Home / Self Care | Payer: Self-pay | Attending: Internal Medicine | Admitting: Internal Medicine

## 2021-03-29 DIAGNOSIS — E785 Hyperlipidemia, unspecified: Secondary | ICD-10-CM | POA: Diagnosis present

## 2021-03-29 DIAGNOSIS — I422 Other hypertrophic cardiomyopathy: Secondary | ICD-10-CM | POA: Diagnosis present

## 2021-03-29 DIAGNOSIS — T405X1A Poisoning by cocaine, accidental (unintentional), initial encounter: Principal | ICD-10-CM | POA: Diagnosis present

## 2021-03-29 DIAGNOSIS — F17210 Nicotine dependence, cigarettes, uncomplicated: Secondary | ICD-10-CM | POA: Diagnosis present

## 2021-03-29 DIAGNOSIS — F1490 Cocaine use, unspecified, uncomplicated: Secondary | ICD-10-CM

## 2021-03-29 DIAGNOSIS — F1410 Cocaine abuse, uncomplicated: Secondary | ICD-10-CM | POA: Diagnosis present

## 2021-03-29 DIAGNOSIS — I214 Non-ST elevation (NSTEMI) myocardial infarction: Secondary | ICD-10-CM | POA: Diagnosis present

## 2021-03-29 DIAGNOSIS — R778 Other specified abnormalities of plasma proteins: Secondary | ICD-10-CM

## 2021-03-29 DIAGNOSIS — N183 Chronic kidney disease, stage 3 unspecified: Secondary | ICD-10-CM | POA: Diagnosis present

## 2021-03-29 DIAGNOSIS — Z79899 Other long term (current) drug therapy: Secondary | ICD-10-CM

## 2021-03-29 DIAGNOSIS — Z833 Family history of diabetes mellitus: Secondary | ICD-10-CM

## 2021-03-29 DIAGNOSIS — R079 Chest pain, unspecified: Secondary | ICD-10-CM | POA: Diagnosis present

## 2021-03-29 DIAGNOSIS — Z20822 Contact with and (suspected) exposure to covid-19: Secondary | ICD-10-CM | POA: Diagnosis present

## 2021-03-29 DIAGNOSIS — Z8249 Family history of ischemic heart disease and other diseases of the circulatory system: Secondary | ICD-10-CM

## 2021-03-29 DIAGNOSIS — N1832 Chronic kidney disease, stage 3b: Secondary | ICD-10-CM | POA: Diagnosis present

## 2021-03-29 DIAGNOSIS — R7989 Other specified abnormal findings of blood chemistry: Secondary | ICD-10-CM

## 2021-03-29 DIAGNOSIS — Z7982 Long term (current) use of aspirin: Secondary | ICD-10-CM

## 2021-03-29 DIAGNOSIS — E876 Hypokalemia: Principal | ICD-10-CM

## 2021-03-29 DIAGNOSIS — Z7952 Long term (current) use of systemic steroids: Secondary | ICD-10-CM

## 2021-03-29 DIAGNOSIS — I10 Essential (primary) hypertension: Secondary | ICD-10-CM

## 2021-03-29 DIAGNOSIS — Z885 Allergy status to narcotic agent status: Secondary | ICD-10-CM

## 2021-03-29 DIAGNOSIS — R911 Solitary pulmonary nodule: Secondary | ICD-10-CM | POA: Diagnosis present

## 2021-03-29 DIAGNOSIS — Z91013 Allergy to seafood: Secondary | ICD-10-CM

## 2021-03-29 DIAGNOSIS — I129 Hypertensive chronic kidney disease with stage 1 through stage 4 chronic kidney disease, or unspecified chronic kidney disease: Secondary | ICD-10-CM | POA: Diagnosis present

## 2021-03-29 DIAGNOSIS — I161 Hypertensive emergency: Secondary | ICD-10-CM

## 2021-03-29 LAB — CBC WITH DIFFERENTIAL/PLATELET
Abs Immature Granulocytes: 0.01 10*3/uL (ref 0.00–0.07)
Basophils Absolute: 0 10*3/uL (ref 0.0–0.1)
Basophils Relative: 1 %
Eosinophils Absolute: 0.2 10*3/uL (ref 0.0–0.5)
Eosinophils Relative: 2 %
HCT: 39.3 % (ref 39.0–52.0)
Hemoglobin: 13.2 g/dL (ref 13.0–17.0)
Immature Granulocytes: 0 %
Lymphocytes Relative: 34 %
Lymphs Abs: 2.2 10*3/uL (ref 0.7–4.0)
MCH: 30.6 pg (ref 26.0–34.0)
MCHC: 33.6 g/dL (ref 30.0–36.0)
MCV: 91.2 fL (ref 80.0–100.0)
Monocytes Absolute: 0.8 10*3/uL (ref 0.1–1.0)
Monocytes Relative: 12 %
Neutro Abs: 3.2 10*3/uL (ref 1.7–7.7)
Neutrophils Relative %: 51 %
Platelets: 190 10*3/uL (ref 150–400)
RBC: 4.31 MIL/uL (ref 4.22–5.81)
RDW: 14.3 % (ref 11.5–15.5)
WBC: 6.3 10*3/uL (ref 4.0–10.5)
nRBC: 0 % (ref 0.0–0.2)

## 2021-03-29 LAB — BASIC METABOLIC PANEL
Anion gap: 7 (ref 5–15)
BUN: 26 mg/dL — ABNORMAL HIGH (ref 6–20)
CO2: 24 mmol/L (ref 22–32)
Calcium: 8.8 mg/dL — ABNORMAL LOW (ref 8.9–10.3)
Chloride: 105 mmol/L (ref 98–111)
Creatinine, Ser: 1.86 mg/dL — ABNORMAL HIGH (ref 0.61–1.24)
GFR, Estimated: 45 mL/min — ABNORMAL LOW (ref >60)
Glucose, Bld: 101 mg/dL — ABNORMAL HIGH (ref 70–99)
Potassium: 3 mmol/L — ABNORMAL LOW (ref 3.5–5.1)
Sodium: 136 mmol/L (ref 135–145)

## 2021-03-29 LAB — HEPATIC FUNCTION PANEL
ALT: 13 U/L (ref 0–44)
AST: 21 U/L (ref 15–41)
Albumin: 3.4 g/dL — ABNORMAL LOW (ref 3.5–5.0)
Alkaline Phosphatase: 45 U/L (ref 38–126)
Bilirubin, Direct: 0.1 mg/dL (ref 0.0–0.2)
Indirect Bilirubin: 0.3 mg/dL (ref 0.3–0.9)
Total Bilirubin: 0.4 mg/dL (ref 0.3–1.2)
Total Protein: 6.6 g/dL (ref 6.5–8.1)

## 2021-03-29 LAB — RESP PANEL BY RT-PCR (FLU A&B, COVID) ARPGX2
Influenza A by PCR: NEGATIVE
Influenza B by PCR: NEGATIVE
SARS Coronavirus 2 by RT PCR: NEGATIVE

## 2021-03-29 LAB — TROPONIN I (HIGH SENSITIVITY)
Troponin I (High Sensitivity): 80 ng/L — ABNORMAL HIGH (ref <18)
Troponin I (High Sensitivity): 273 ng/L (ref <18)
Troponin I (High Sensitivity): 904 ng/L (ref <18)
Troponin I (High Sensitivity): 1014 ng/L (ref <18)

## 2021-03-29 LAB — D-DIMER, QUANTITATIVE: D-Dimer, Quant: 1.1 ug/mL-FEU — ABNORMAL HIGH (ref 0.00–0.50)

## 2021-03-29 LAB — BRAIN NATRIURETIC PEPTIDE: B Natriuretic Peptide: 216.6 pg/mL — ABNORMAL HIGH (ref 0.0–100.0)

## 2021-03-29 MED ORDER — HEPARIN SODIUM (PORCINE) 5000 UNIT/ML IJ SOLN: 5000.0000 [IU] | Freq: Three times a day (TID) | INTRAMUSCULAR | Status: DC

## 2021-03-29 MED ORDER — HYDRALAZINE HCL 20 MG/ML IJ SOLN
10.0000 mg | Freq: Once | INTRAMUSCULAR | Status: AC
  Administered 2021-03-29: 10 mg via INTRAVENOUS
  Filled 2021-03-29: qty 1

## 2021-03-29 MED ORDER — POTASSIUM CHLORIDE 10 MEQ/100ML IV SOLN
10.0000 meq | INTRAVENOUS | Status: AC
  Administered 2021-03-29 (×2): 10 meq via INTRAVENOUS
  Filled 2021-03-29 (×2): qty 100

## 2021-03-29 MED ORDER — POTASSIUM CHLORIDE CRYS ER 20 MEQ PO TBCR
40.0000 meq | EXTENDED_RELEASE_TABLET | Freq: Once | ORAL | Status: AC
  Administered 2021-03-29: 40 meq via ORAL
  Filled 2021-03-29: qty 2

## 2021-03-29 MED ORDER — ASPIRIN 81 MG PO CHEW
324.0000 mg | CHEWABLE_TABLET | Freq: Once | ORAL | Status: AC
  Administered 2021-03-29: 324 mg via ORAL
  Filled 2021-03-29: qty 4

## 2021-03-29 MED ORDER — ONDANSETRON HCL 4 MG/2ML IJ SOLN
4.0000 mg | Freq: Four times a day (QID) | INTRAMUSCULAR | Status: DC | PRN
  Administered 2021-03-29: 4 mg via INTRAVENOUS
  Filled 2021-03-29: qty 2

## 2021-03-29 MED ORDER — AMLODIPINE BESYLATE 5 MG PO TABS
5.0000 mg | ORAL_TABLET | Freq: Once | ORAL | Status: AC
  Administered 2021-03-29: 5 mg via ORAL
  Filled 2021-03-29: qty 1

## 2021-03-29 MED ORDER — ALUM & MAG HYDROXIDE-SIMETH 200-200-20 MG/5ML PO SUSP
30.0000 mL | Freq: Once | ORAL | Status: AC
  Administered 2021-03-29: 30 mL via ORAL
  Filled 2021-03-29: qty 30

## 2021-03-29 MED ORDER — ACETAMINOPHEN 325 MG PO TABS
650.0000 mg | ORAL_TABLET | ORAL | Status: DC | PRN
  Administered 2021-03-29: 650 mg via ORAL
  Filled 2021-03-29: qty 2

## 2021-03-29 MED ORDER — IOHEXOL 350 MG/ML SOLN
100.0000 mL | Freq: Once | INTRAVENOUS | Status: AC | PRN
  Administered 2021-03-29: 100 mL via INTRAVENOUS

## 2021-03-29 MED ORDER — HEPARIN (PORCINE) 25000 UT/250ML-% IV SOLN
1750.0000 [IU]/h | INTRAVENOUS | Status: DC
  Administered 2021-03-29: 1150 [IU]/h via INTRAVENOUS
  Administered 2021-03-30 – 2021-03-31 (×3): 1750 [IU]/h via INTRAVENOUS
  Filled 2021-03-29 (×4): qty 250

## 2021-03-29 MED ORDER — NITROGLYCERIN 0.4 MG SL SUBL: 0.4000 mg | SUBLINGUAL_TABLET | SUBLINGUAL | Status: DC | PRN

## 2021-03-29 MED ORDER — HEPARIN SODIUM (PORCINE) 5000 UNIT/ML IJ SOLN
5000.0000 [IU] | Freq: Three times a day (TID) | INTRAMUSCULAR | Status: DC
  Administered 2021-03-29: 5000 [IU] via SUBCUTANEOUS
  Filled 2021-03-29: qty 1

## 2021-03-29 MED ORDER — SODIUM CHLORIDE 0.45 % IV SOLN: INTRAVENOUS | Status: DC

## 2021-03-29 NOTE — Progress Notes (Signed)
ANTICOAGULATION CONSULT NOTE - Initial Consult  Pharmacy Consult for IV heparin Indication: chest pain/ACS  Allergies  Allergen Reactions  . Codeine Itching  . Shellfish Allergy Swelling    Patient Measurements: Height: 5\' 4"  (162.6 cm) Weight: 98.2 kg (216 lb 8 oz) IBW/kg (Calculated) : 59.2 Heparin Dosing Weight: 81.2 kg  Vital Signs: Temp: 98.6 F (37 C) (04/29 1920) Temp Source: Oral (04/29 1920) BP: 140/79 (04/29 1920) Pulse Rate: 80 (04/29 1920)  Labs: Recent Labs    03/29/21 1046 03/29/21 1235 03/29/21 1557 03/29/21 1845  HGB 13.2  --   --   --   HCT 39.3  --   --   --   PLT 190  --   --   --   CREATININE 1.86*  --   --   --   TROPONINIHS 80* 273* 904* 1,014*    Estimated Creatinine Clearance: 53.1 mL/min (A) (by C-G formula based on SCr of 1.86 mg/dL (H)).   Medical History: Past Medical History:  Diagnosis Date  . Hypertension     Medications:  Infusions:  . sodium chloride Stopped (03/29/21 2035)  . heparin      Assessment: 45 yo male admitted with SOB, now with rising troponins.  Pharmacy asked to start IV heparin.  Pt received sq heparin 5000 units about 1 hr ago.  Baseline CBC WNL.  No known hx bleeding.  Goal of Therapy:  Heparin level 0.3-0.7 units/ml Monitor platelets by anticoagulation protocol: Yes   Plan:  Start IV heparin at 1150 units/hr.  No bolus given recent SQ heparin. Check heparin level in 8 hrs. Daily heparin level and CBC.  54, Reece Leader, BCCP Clinical Pharmacist  03/29/2021 10:30 PM   [facility] pharmacy phone numbers are listed on [email or web address]

## 2021-03-29 NOTE — Progress Notes (Signed)
Spoke with bed placement and they told they were unsure if the admitting MD would have time to come assess pt before the end of his shift and to page floor coverage for concerns and questions. Floor coverage paged .

## 2021-03-29 NOTE — Progress Notes (Signed)
Pt requesting to speak with MD. Admitting MD paged .

## 2021-03-29 NOTE — Progress Notes (Signed)
Bedside report received. Pt vss. No acute distress noted. No complaints of cp or soa at this time. Will assume poc and cont to monitor pt . See charted full assessments.

## 2021-03-29 NOTE — ED Triage Notes (Signed)
Reports SOB that was mild yesterday and worse today while working.  Reports chest pressure across center of the chest.

## 2021-03-29 NOTE — ED Notes (Signed)
Report given to [facility] 

## 2021-03-29 NOTE — ED Notes (Signed)
Judeth Cornfield RN receiving patient on 6E made aware of repeat trop of 904, states she will make admitting aware upon patient arrival (in transit at this time), Myrtis Ser ED MD also made aware

## 2021-03-29 NOTE — ED Provider Notes (Addendum)
[facility] EMERGENCY DEPARTMENT Provider Note   CSN: [id number] Arrival date & time: 03/29/21  1020     History Chief Complaint  Patient presents with  . Shortness of Breath    Anthony Estrada is a 45 y.o. male.  The history is provided by the patient.  Shortness of Breath Severity:  Mild Onset quality:  Gradual Duration:  2 days Timing:  Constant Progression:  Unchanged Chronicity:  New Context comment:  Cocaine use recently, SOB and some chest pressure on and off last two days. No cough or fever Relieved by:  Nothing Worsened by:  Nothing Associated symptoms: chest pain   Associated symptoms: no abdominal pain, no cough, no ear pain, no fever, no rash, no sore throat and no vomiting   Risk factors: no hx of PE/DVT   Risk factors comment:  Smoker, cocaine use      Past Medical History:  Diagnosis Date  . Hypertension     Patient Active Problem List   Diagnosis Date Noted  . Tendinitis of right elbow 02/12/2021  . Sciatica of left side 11/02/2020  . Solitary pulmonary nodule 04/19/2020  . Stage 3 chronic kidney disease (HCC) 04/19/2020  . Chest pain 11/21/2019  . Elevated blood pressure reading in office with diagnosis of hypertension 12/15/2018    Past Surgical History:  Procedure Laterality Date  . no past surgery         Family History  Problem Relation Age of Onset  . Hypertension Mother   . Diabetes Mother   . Hypertension Father   . Diabetes Father     Social History   Tobacco Use  . Smoking status: Current Every Day Smoker    Packs/day: 1.00    Types: Cigars  . Smokeless tobacco: Never Used  Vaping Use  . Vaping Use: Never used  Substance Use Topics  . Alcohol use: Yes    Comment: occasionally  . Drug use: Yes    Types: Cocaine    Comment: two days ago,  uses occasionally    Home Medications Prior to Admission medications   Medication Sig Start Date End Date Taking? Authorizing Provider  amLODipine (NORVASC) 10 MG  tablet TAKE 1 TABLET (10 MG TOTAL) BY MOUTH DAILY. 02/11/21 02/11/22  Hoy RegisterNewlin, Enobong, MD  aspirin EC 325 MG tablet Take 1 tablet (325 mg total) by mouth daily. 08/29/20   Zadie RhineWickline, Donald, MD  hydrALAZINE (APRESOLINE) 50 MG tablet TAKE 1 TABLET (50 MG TOTAL) BY MOUTH IN THE MORNING AND AT BEDTIME. 02/11/21 02/11/22  Hoy RegisterNewlin, Enobong, MD  hydrochlorothiazide (HYDRODIURIL) 25 MG tablet TAKE 1 TABLET (25 MG TOTAL) BY MOUTH DAILY. 02/11/21 02/11/22  Hoy RegisterNewlin, Enobong, MD  ibuprofen (ADVIL) 600 MG tablet TAKE 1 TABLET (600 MG TOTAL) BY MOUTH EVERY 8 (EIGHT) HOURS AS NEEDED. 02/12/21 02/12/22  Mayers, Cari S, PA-C  methocarbamol (ROBAXIN) 500 MG tablet Take 1 tablet (500 mg total) by mouth 2 (two) times daily. 11/02/20   Marcine MatarJohnson, Deborah B, MD  oxyCODONE-acetaminophen (PERCOCET/ROXICET) 5-325 MG tablet Take 1 tablet by mouth every 6 (six) hours as needed for severe pain. Patient not taking: Reported on 11/02/2020 10/29/20   Storm FriskWright, Patrick E, MD  predniSONE (DELTASONE) 10 MG tablet Take 1 tablet (10 mg total) by mouth daily with breakfast. 11/02/20   Marcine MatarJohnson, Deborah B, MD    Allergies    Codeine and Shellfish allergy  Review of Systems   Review of Systems  Constitutional: Negative for chills and fever.  HENT: Negative for  ear pain and sore throat.   Eyes: Negative for pain and visual disturbance.  Respiratory: Positive for shortness of breath. Negative for cough.   Cardiovascular: Positive for chest pain. Negative for palpitations.  Gastrointestinal: Negative for abdominal pain and vomiting.  Genitourinary: Negative for dysuria and hematuria.  Musculoskeletal: Negative for arthralgias and back pain.  Skin: Negative for color change and rash.  Neurological: Negative for seizures and syncope.  All other systems reviewed and are negative.   Physical Exam Updated Vital Signs  ED Triage Vitals  Enc Vitals Group     BP 03/29/21 1031 125/78     Pulse Rate 03/29/21 1031 89     Resp 03/29/21 1031 14      Temp 03/29/21 1031 98.4 F (36.9 C)     Temp Source 03/29/21 1031 Oral     SpO2 03/29/21 1031 99 %     Weight 03/29/21 1033 223 lb 5.2 oz (101.3 kg)     Height 03/29/21 1033 5\' 4"  (1.626 m)     Head Circumference --      Peak Flow --      Pain Score 03/29/21 1032 6     Pain Loc --      Pain Edu? --      Excl. in GC? --     Physical Exam Vitals and nursing note reviewed.  Constitutional:      General: He is not in acute distress.    Appearance: He is well-developed. He is not ill-appearing.  HENT:     Head: Normocephalic and atraumatic.  Eyes:     Conjunctiva/sclera: Conjunctivae normal.     Pupils: Pupils are equal, round, and reactive to light.  Cardiovascular:     Rate and Rhythm: Normal rate and regular rhythm.     Pulses: Normal pulses.     Heart sounds: Normal heart sounds. No murmur heard.   Pulmonary:     Effort: Pulmonary effort is normal. No respiratory distress.     Breath sounds: Normal breath sounds. No decreased breath sounds, wheezing or rhonchi.  Abdominal:     Palpations: Abdomen is soft.     Tenderness: There is no abdominal tenderness.  Musculoskeletal:        General: Normal range of motion.     Cervical back: Normal range of motion and neck supple.     Right lower leg: No edema.     Left lower leg: No edema.  Skin:    General: Skin is warm and dry.     Capillary Refill: Capillary refill takes less than 2 seconds.  Neurological:     General: No focal deficit present.     Mental Status: He is alert.  Psychiatric:        Mood and Affect: Mood normal.     ED Results / Procedures / Treatments   Labs (all labs ordered are listed, but only abnormal results are displayed) Labs Reviewed  BASIC METABOLIC PANEL - Abnormal; Notable for the following components:      Result Value   Potassium 3.0 (*)    Glucose, Bld 101 (*)    BUN 26 (*)    Creatinine, Ser 1.86 (*)    Calcium 8.8 (*)    GFR, Estimated 45 (*)    All other components within normal  limits  HEPATIC FUNCTION PANEL - Abnormal; Notable for the following components:   Albumin 3.4 (*)    All other components within normal limits  BRAIN NATRIURETIC PEPTIDE -  Abnormal; Notable for the following components:   B Natriuretic Peptide 216.6 (*)    All other components within normal limits  D-DIMER, QUANTITATIVE - Abnormal; Notable for the following components:   D-Dimer, Quant 1.10 (*)    All other components within normal limits  TROPONIN I (HIGH SENSITIVITY) - Abnormal; Notable for the following components:   Troponin I (High Sensitivity) 80 (*)    All other components within normal limits  TROPONIN I (HIGH SENSITIVITY) - Abnormal; Notable for the following components:   Troponin I (High Sensitivity) 273 (*)    All other components within normal limits  RESP PANEL BY RT-PCR (FLU A&B, COVID) ARPGX2  CBC WITH DIFFERENTIAL/PLATELET    EKG EKG Interpretation  Date/Time:  Friday March 29 2021 10:28:48 EDT Ventricular Rate:  90 PR Interval:  156 QRS Duration: 89 QT Interval:  370 QTC Calculation: 453 R Axis:   54 Text Interpretation: Sinus rhythm Biatrial enlargement LVH with secondary repolarization abnormality Confirmed by Virgina Norfolk ([id number]) on 03/29/2021 10:37:06 AM   Radiology CT Angio Chest PE W and/or Wo Contrast  Result Date: 03/29/2021 CLINICAL DATA:  Shortness of breath EXAM: CT ANGIOGRAPHY CHEST WITH CONTRAST TECHNIQUE: Multidetector CT imaging of the chest was performed using the standard protocol during bolus administration of intravenous contrast. Multiplanar CT image reconstructions and MIPs were obtained to evaluate the vascular anatomy. CONTRAST:  OMNIPAQUE IOHEXOL 350 MG/ML SOLN COMPARISON:  Chest radiography same day.  CT 10/29/2020. FINDINGS: Cardiovascular: Heart size is normal. No coronary artery calcification. Very minimal thoracic aortic atherosclerotic calcification. Pulmonary arterial opacification is good. There are no pulmonary emboli.  Mediastinum/Nodes: No mass or lymphadenopathy. Lungs/Pleura: Redemonstration of a 1.8 x 1.6 cm well-circumscribed nodule in the left upper lobe, stable since 11/22/2019. This is favored to represent a benign nodule. However, follow-up through December of 2022 is recommended as described previously to achieve 24 month follow-up. No pleural effusion. No second pulmonary parenchymal finding. Upper Abdomen: Normal Musculoskeletal: No significant skeletal finding. Review of the MIP images confirms the above findings. IMPRESSION: 1. No pulmonary emboli or other acute chest pathology. 2. Redemonstration of a 1.8 x 1.6 cm well-circumscribed nodule in the left upper lobe, stable since 11/22/2019. This is favored to represent a benign nodule. Follow-up through December of 2022 suggested, as described previously, to achieve 24 month follow-up. 3. Aortic atherosclerosis. Aortic Atherosclerosis (ICD10-I70.0). Electronically Signed   By: Paulina Fusi M.D.   On: 03/29/2021 13:20   DG Chest Portable 1 View  Result Date: 03/29/2021 CLINICAL DATA:  Shortness of breath.  Chest pressure. EXAM: PORTABLE CHEST 1 VIEW COMPARISON:  August 28, 2020. FINDINGS: Mild enlargement the cardiac silhouette, likely accentuated by AP portable technique. No consolidation. Similar size of an approximately 1.6 cm left upper lobe for a nodule, better characterized on CT chest from November 29, 21. No visible pleural effusions or pneumothorax. No acute osseous abnormality. IMPRESSION: 1. No evidence of acute cardiopulmonary disease. 2. Similar size of an approximately 1.6 cm left upper lobe for a nodule, better characterized on CT chest from November 29, 21. Electronically Signed   By: Feliberto Harts MD   On: 03/29/2021 11:15    Procedures .Critical Care Performed by: Virgina Norfolk, DO Authorized by: Virgina Norfolk, DO   Critical care provider statement:    Critical care time (minutes):  35   Critical care was necessary to treat or  prevent imminent or life-threatening deterioration of the following conditions: hypertensive emergency.elevated trop.   Critical care was  time spent personally by me on the following activities:  Blood draw for specimens, development of treatment plan with patient or surrogate, discussions with consultants, discussions with primary provider, evaluation of patient's response to treatment, obtaining history from patient or surrogate, ordering and performing treatments and interventions, ordering and review of laboratory studies, ordering and review of radiographic studies, pulse oximetry, re-evaluation of patient's condition and review of old charts   I assumed direction of critical care for this patient from another provider in my specialty: no       Medications Ordered in ED Medications  potassium chloride 10 mEq in 100 mL IVPB (has no administration in time range)  potassium chloride SA (KLOR-CON) CR tablet 40 mEq (has no administration in time range)  hydrALAZINE (APRESOLINE) injection 10 mg (has no administration in time range)  amLODipine (NORVASC) tablet 5 mg (has no administration in time range)  iohexol (OMNIPAQUE) 350 MG/ML injection 100 mL (100 mLs Intravenous Contrast Given 03/29/21 1242)    ED Course  I have reviewed the triage vital signs and the nursing notes.  Pertinent labs & imaging results that were available during my care of the patient were reviewed by me and considered in my medical decision making (see chart for details).    MDM Rules/Calculators/A&P                          Barbette Hair Risser is here with shortness of breath, chest pain for the last 2 days.  Normal vitals.  No fever.  Does admit to cocaine use 2 days ago.  Overall appears comfortable.  EKG shows sinus rhythm.  No ischemic changes.  Unchanged from prior EKG.  Will evaluate for fluid overload versus ACS versus PE with blood work.  Will get chest x-ray to evaluate for infection.  Could be vasospasm in setting  of cocaine use.  Creatinine overall at baseline.  Mild elevation of BNP and troponin similar to prior.  Suspect could be in the setting of recent cocaine use.  Will trend second troponin.  Chest x-ray no signs of volume overload or pneumonia.  D-dimer is elevated.  Known pulmonary nodule appears to be stable.  Will trend troponin and obtain a CT scan of the chest to rule out PE.   Troponin continued to elevated to 273.  PE scan was unremarkable.  Potassium is 3.0.  Will replete potassium.  Talked with Dr. Anne Fu with cardiology.  He recommends medicine admission.  Work on blood pressure control as blood pressure is now slightly elevated to 170/100.  Avoid beta-blockers.  He recommends holding heparin at this time unless troponin continues to double.  Overall suspect cocaine vasospasm/hypertensive emergency.  States that coronary vessels on CT scan overall unremarkable.  Recommends echocardiogram.  Will admit to medicine for further care.  Will give a dose of IV hydralazine.  We will start patient on amlodipine.  We will continue to trend troponin.  Repeat EKG is stable.  No ST elevation.  This chart was dictated using voice recognition software.  Despite best efforts to proofread,  errors can occur which can change the documentation meaning.   Final Clinical Impression(s) / ED Diagnoses Final diagnoses:  Hypokalemia  Cocaine use  Elevated troponin  Hypertensive emergency    Rx / DC Orders ED Discharge Orders    None       Virgina Norfolk, DO 03/29/21 1405    Virgina Norfolk, DO 03/29/21 1411

## 2021-03-30 ENCOUNTER — Observation Stay (HOSPITAL_COMMUNITY): Payer: Self-pay

## 2021-03-30 DIAGNOSIS — F1490 Cocaine use, unspecified, uncomplicated: Secondary | ICD-10-CM

## 2021-03-30 DIAGNOSIS — I214 Non-ST elevation (NSTEMI) myocardial infarction: Secondary | ICD-10-CM

## 2021-03-30 DIAGNOSIS — I10 Essential (primary) hypertension: Secondary | ICD-10-CM

## 2021-03-30 DIAGNOSIS — R079 Chest pain, unspecified: Secondary | ICD-10-CM

## 2021-03-30 LAB — BASIC METABOLIC PANEL
Anion gap: 7 (ref 5–15)
BUN: 19 mg/dL (ref 6–20)
CO2: 26 mmol/L (ref 22–32)
Calcium: 8.7 mg/dL — ABNORMAL LOW (ref 8.9–10.3)
Chloride: 105 mmol/L (ref 98–111)
Creatinine, Ser: 1.75 mg/dL — ABNORMAL HIGH (ref 0.61–1.24)
GFR, Estimated: 48 mL/min — ABNORMAL LOW (ref >60)
Glucose, Bld: 89 mg/dL (ref 70–99)
Potassium: 3.9 mmol/L (ref 3.5–5.1)
Sodium: 138 mmol/L (ref 135–145)

## 2021-03-30 LAB — ECHOCARDIOGRAM COMPLETE
AR max vel: 2.46 cm2
AV Area VTI: 2.5 cm2
AV Area mean vel: 2.27 cm2
AV Mean grad: 8 mmHg
AV Peak grad: 15.1 mmHg
Ao pk vel: 1.94 m/s
Area-P 1/2: 3.12 cm2
Height: 64 in
MV VTI: 2.37 cm2
S' Lateral: 2.9 cm
Weight: 3457.6 oz

## 2021-03-30 LAB — LIPID PANEL
Cholesterol: 145 mg/dL (ref 0–200)
HDL: 46 mg/dL (ref >40)
LDL Cholesterol: 87 mg/dL (ref 0–99)
Total CHOL/HDL Ratio: 3.2 RATIO
Triglycerides: 62 mg/dL (ref <150)
VLDL: 12 mg/dL (ref 0–40)

## 2021-03-30 LAB — HEPARIN LEVEL (UNFRACTIONATED)
Heparin Unfractionated: 0.16 IU/mL — ABNORMAL LOW (ref 0.30–0.70)
Heparin Unfractionated: 0.21 IU/mL — ABNORMAL LOW (ref 0.30–0.70)
Heparin Unfractionated: 0.49 IU/mL (ref 0.30–0.70)

## 2021-03-30 LAB — CBC
HCT: 40.3 % (ref 39.0–52.0)
Hemoglobin: 13.6 g/dL (ref 13.0–17.0)
MCH: 30.8 pg (ref 26.0–34.0)
MCHC: 33.7 g/dL (ref 30.0–36.0)
MCV: 91.4 fL (ref 80.0–100.0)
Platelets: 183 10*3/uL (ref 150–400)
RBC: 4.41 MIL/uL (ref 4.22–5.81)
RDW: 14.3 % (ref 11.5–15.5)
WBC: 4.2 10*3/uL (ref 4.0–10.5)
nRBC: 0 % (ref 0.0–0.2)

## 2021-03-30 LAB — HIV ANTIBODY (ROUTINE TESTING W REFLEX): HIV Screen 4th Generation wRfx: NONREACTIVE

## 2021-03-30 MED ORDER — AMLODIPINE BESYLATE 10 MG PO TABS
10.0000 mg | ORAL_TABLET | Freq: Every day | ORAL | Status: DC
  Administered 2021-03-30 – 2021-04-01 (×3): 10 mg via ORAL
  Filled 2021-03-30 (×3): qty 1

## 2021-03-30 MED ORDER — LACTATED RINGERS IV SOLN: INTRAVENOUS | Status: DC

## 2021-03-30 MED ORDER — SODIUM CHLORIDE 0.9% FLUSH: 3.0000 mL | INTRAVENOUS | Status: DC | PRN

## 2021-03-30 MED ORDER — SODIUM CHLORIDE 0.9% FLUSH
3.0000 mL | Freq: Two times a day (BID) | INTRAVENOUS | Status: DC
Start: 2021-03-30 — End: 2021-03-30

## 2021-03-30 MED ORDER — SODIUM CHLORIDE 0.9 % WEIGHT BASED INFUSION: 3.0000 mL/kg/h | INTRAVENOUS | Status: DC

## 2021-03-30 MED ORDER — ROSUVASTATIN CALCIUM 20 MG PO TABS
20.0000 mg | ORAL_TABLET | Freq: Every day | ORAL | Status: DC
  Administered 2021-03-30 – 2021-04-01 (×3): 20 mg via ORAL
  Filled 2021-03-30 (×3): qty 1

## 2021-03-30 MED ORDER — SODIUM CHLORIDE 0.9 % IV SOLN: 250.0000 mL | INTRAVENOUS | Status: DC | PRN

## 2021-03-30 MED ORDER — CARVEDILOL 6.25 MG PO TABS
6.2500 mg | ORAL_TABLET | Freq: Two times a day (BID) | ORAL | Status: DC
  Administered 2021-03-30 – 2021-03-31 (×4): 6.25 mg via ORAL
  Filled 2021-03-30 (×4): qty 1

## 2021-03-30 MED ORDER — HEPARIN BOLUS VIA INFUSION
3000.0000 [IU] | Freq: Once | INTRAVENOUS | Status: AC
  Administered 2021-03-30: 3000 [IU] via INTRAVENOUS
  Filled 2021-03-30: qty 3000

## 2021-03-30 MED ORDER — NITROGLYCERIN 0.4 MG SL SUBL: 0.4000 mg | SUBLINGUAL_TABLET | SUBLINGUAL | Status: DC | PRN

## 2021-03-30 MED ORDER — HYDRALAZINE HCL 20 MG/ML IJ SOLN: 10.0000 mg | Freq: Four times a day (QID) | INTRAMUSCULAR | Status: DC | PRN

## 2021-03-30 MED ORDER — ASPIRIN EC 81 MG PO TBEC
81.0000 mg | DELAYED_RELEASE_TABLET | Freq: Every day | ORAL | Status: DC
  Administered 2021-03-31 – 2021-04-01 (×2): 81 mg via ORAL
  Filled 2021-03-30 (×3): qty 1

## 2021-03-30 MED ORDER — ASPIRIN 81 MG PO CHEW: 324.0000 mg | CHEWABLE_TABLET | ORAL | Status: DC

## 2021-03-30 MED ORDER — SODIUM CHLORIDE 0.9 % WEIGHT BASED INFUSION
1.0000 mL/kg/h | INTRAVENOUS | Status: DC
  Administered 2021-04-01: 1 mL/kg/h via INTRAVENOUS

## 2021-03-30 MED ORDER — ASPIRIN 300 MG RE SUPP
300.0000 mg | RECTAL | Status: DC
  Filled 2021-03-30: qty 1

## 2021-03-30 NOTE — Progress Notes (Signed)
ANTICOAGULATION CONSULT NOTE - Follow Up Consult  Pharmacy Consult for heparin Indication: NSTEMI  Labs: Recent Labs    03/29/21 1046 03/29/21 1235 03/29/21 1557 03/29/21 1845 03/30/21 0434 03/30/21 0616  HGB 13.2  --   --   --  13.6  --   HCT 39.3  --   --   --  40.3  --   PLT 190  --   --   --  183  --   HEPARINUNFRC  --   --   --   --   --  0.16*  CREATININE 1.86*  --   --   --  1.75*  --   TROPONINIHS 80* 273* 904* 1,014*  --   --     Assessment: 45yo male subtherapeutic on heparin with initial dosing for NSTEMI; no gtt issues or signs of bleeding per RN.  Goal of Therapy:  Heparin level 0.3-0.7 units/ml   Plan:  Will give heparin 3000 units bolus and increase heparin gtt by 3-4 units/kg/hr to 1500 units/hr and check level in 6 hours.    Vernard Gambles, PharmD, BCPS  03/30/2021,6:55 AM

## 2021-03-30 NOTE — Progress Notes (Signed)
Progress Note  Patient Name: Anthony Estrada Date of Encounter: 03/30/2021  [facility] Cardiologist: No primary care provider on file.   Subjective   Patient feeling well today.  No chest pain or shortness of breath.  Inpatient Medications    Scheduled Meds: . amLODipine  10 mg Oral Daily  . [START ON 03/31/2021] aspirin EC  81 mg Oral Daily   Continuous Infusions: . heparin 1,500 Units/hr (03/30/21 0705)  . lactated ringers 100 mL/hr at 03/30/21 0444   PRN Meds: acetaminophen, nitroGLYCERIN, ondansetron (ZOFRAN) IV   Vital Signs    Vitals:   03/29/21 1748 03/29/21 1920 03/29/21 2300 03/30/21 0440  BP: 139/86 140/79 135/73 (!) 152/107  Pulse: 68 80 83 95  Resp: 16  16 18   Temp: 98 F (36.7 C) 98.6 F (37 C) 98.4 F (36.9 C) 98.2 F (36.8 C)  TempSrc: Oral Oral Oral Oral  SpO2: 100% 100% 98% 99%  Weight: 98.2 kg   98 kg  Height: 5\' 4"  (1.626 m)       Intake/Output Summary (Last 24 hours) at 03/30/2021 0905 Last data filed at 03/30/2021 0444 Gross per 24 hour  Intake 1390.5 ml  Output --  Net 1390.5 ml   Last 3 Weights 03/30/2021 03/29/2021 03/29/2021  Weight (lbs) 216 lb 1.6 oz 216 lb 8 oz 223 lb 5.2 oz  Weight (kg) 98.022 kg 98.204 kg 101.3 kg      Telemetry    Sinus rhythm- Personally Reviewed  ECG    Sinus rhythm, diffuse T wave inversions, LVH- Personally Reviewed  Physical Exam   GEN: No acute distress.   Neck: No JVD Cardiac: RRR, no murmurs, rubs, or gallops.  Respiratory: Clear to auscultation bilaterally. GI: Soft, nontender, non-distended  MS: No edema; No deformity. Neuro:  Nonfocal  Psych: Normal affect   Labs    High Sensitivity Troponin:   Recent Labs  Lab 03/29/21 1046 03/29/21 1235 03/29/21 1557 03/29/21 1845  TROPONINIHS 80* 273* 904* 1,014*      Chemistry Recent Labs  Lab 03/29/21 1046 03/30/21 0434  NA 136 138  K 3.0* 3.9  CL 105 105  CO2 24 26  GLUCOSE 101* 89  BUN 26* 19  CREATININE 1.86* 1.75*   CALCIUM 8.8* 8.7*  PROT 6.6  --   ALBUMIN 3.4*  --   AST 21  --   ALT 13  --   ALKPHOS 45  --   BILITOT 0.4  --   GFRNONAA 45* 48*  ANIONGAP 7 7     Hematology Recent Labs  Lab 03/29/21 1046 03/30/21 0434  WBC 6.3 4.2  RBC 4.31 4.41  HGB 13.2 13.6  HCT 39.3 40.3  MCV 91.2 91.4  MCH 30.6 30.8  MCHC 33.6 33.7  RDW 14.3 14.3  PLT 190 183    BNP Recent Labs  Lab 03/29/21 1046  BNP 216.6*     DDimer  Recent Labs  Lab 03/29/21 1046  DDIMER 1.10*     Radiology    CT Angio Chest PE W and/or Wo Contrast  Result Date: 03/29/2021 CLINICAL DATA:  Shortness of breath EXAM: CT ANGIOGRAPHY CHEST WITH CONTRAST TECHNIQUE: Multidetector CT imaging of the chest was performed using the standard protocol during bolus administration of intravenous contrast. Multiplanar CT image reconstructions and MIPs were obtained to evaluate the vascular anatomy. CONTRAST:  03/31/21 OMNIPAQUE IOHEXOL 350 MG/ML SOLN COMPARISON:  Chest radiography same day.  CT 10/29/2020. FINDINGS: Cardiovascular: Heart size is normal. No coronary  artery calcification. Very minimal thoracic aortic atherosclerotic calcification. Pulmonary arterial opacification is good. There are no pulmonary emboli. Mediastinum/Nodes: No mass or lymphadenopathy. Lungs/Pleura: Redemonstration of a 1.8 x 1.6 cm well-circumscribed nodule in the left upper lobe, stable since 11/22/2019. This is favored to represent a benign nodule. However, follow-up through December of 2022 is recommended as described previously to achieve 24 month follow-up. No pleural effusion. No second pulmonary parenchymal finding. Upper Abdomen: Normal Musculoskeletal: No significant skeletal finding. Review of the MIP images confirms the above findings. IMPRESSION: 1. No pulmonary emboli or other acute chest pathology. 2. Redemonstration of a 1.8 x 1.6 cm well-circumscribed nodule in the left upper lobe, stable since 11/22/2019. This is favored to represent a benign  nodule. Follow-up through December of 2022 suggested, as described previously, to achieve 24 month follow-up. 3. Aortic atherosclerosis. Aortic Atherosclerosis (ICD10-I70.0). Electronically Signed   By: Paulina Fusi M.D.   On: 03/29/2021 13:20   DG Chest Portable 1 View  Result Date: 03/29/2021 CLINICAL DATA:  Shortness of breath.  Chest pressure. EXAM: PORTABLE CHEST 1 VIEW COMPARISON:  August 28, 2020. FINDINGS: Mild enlargement the cardiac silhouette, likely accentuated by AP portable technique. No consolidation. Similar size of an approximately 1.6 cm left upper lobe for a nodule, better characterized on CT chest from November 29, 21. No visible pleural effusions or pneumothorax. No acute osseous abnormality. IMPRESSION: 1. No evidence of acute cardiopulmonary disease. 2. Similar size of an approximately 1.6 cm left upper lobe for a nodule, better characterized on CT chest from November 29, 21. Electronically Signed   By: Feliberto Harts MD   On: 03/29/2021 11:15    Cardiac Studies   Echo pending  Patient Profile     45 y.o. male who presented to the hospital with chest pain and shortness of breath, found to have non-STEMI  Assessment & Plan    1.  Non-STEMI: Patient currently on aspirin and heparin.  He has an echo pending for today.  He does have a history of cocaine use, 2 days ago, though I do feel he would benefit from coronary evaluation.  We Reza Crymes plan for left heart catheterization on Monday.  The patient understands that risks include but are not limited to stroke (1 in 1000), death (1 in 1000), kidney failure [usually temporary] (1 in 500), bleeding (1 in 200), allergic reaction [possibly serious] (1 in 200), and agrees to proceed.   2.  Hypertension: Currently on amlodipine.  Blood pressure remains elevated.  We Trai Ells add Coreg 6.25 mg.  3.  Hyperlipidemia: Goal LDL less than 70.  We Akshay Spang start Crestor 20 mg.      For questions or updates, please contact [facility] Please consult [email or web address] for contact info under        Signed, Starlyn Droge Jorja Loa, MD  03/30/2021, 9:05 AM

## 2021-03-30 NOTE — Progress Notes (Signed)
ANTICOAGULATION CONSULT NOTE  Pharmacy Consult for Heparin Indication: NSTEMI  Allergies  Allergen Reactions  . Codeine Itching  . Shellfish Allergy Swelling    Patient Measurements: Height: 5\' 4"  (162.6 cm) Weight: 98 kg (216 lb 1.6 oz) IBW/kg (Calculated) : 59.2 Heparin Dosing Weight: 81.3 kg  Vital Signs: Temp: 99 F (37.2 C) (04/30 2010) Temp Source: Oral (04/30 2010) BP: 135/73 (04/30 2010) Pulse Rate: 65 (04/30 2010)  Labs: Recent Labs    03/29/21 1046 03/29/21 1235 03/29/21 1557 03/29/21 1845 03/30/21 0434 03/30/21 0616 03/30/21 1311 03/30/21 2105  HGB 13.2  --   --   --  13.6  --   --   --   HCT 39.3  --   --   --  40.3  --   --   --   PLT 190  --   --   --  183  --   --   --   HEPARINUNFRC  --   --   --   --   --  0.16* 0.21* 0.49  CREATININE 1.86*  --   --   --  1.75*  --   --   --   TROPONINIHS 80* 273* 904* 1,014*  --   --   --   --     Estimated Creatinine Clearance: 56.3 mL/min (A) (by C-G formula based on SCr of 1.75 mg/dL (H)).   Medical History: Past Medical History:  Diagnosis Date  . Hypertension     Medications:  Infusions:  . [START ON 04/01/2021] sodium chloride     Followed by  . [START ON 04/01/2021] sodium chloride    . heparin 1,750 Units/hr (03/30/21 2100)    Assessment: 45 yo male with NSTEMI, pharmacy consulted to dose heparin. Plans are for left heart catheterization Monday. -heparin level at goal on 1750 units/hr   Goal of Therapy:  Heparin level 0.3-0.7 units/ml Monitor platelets by anticoagulation protocol: Yes  Plan:  -Continue heparin at 1750 units/hr. -Daily heparin level and CBC  Monday, PharmD Clinical Pharmacist **Pharmacist phone directory can now be found on [email or web address] ([email or web address]).  Listed under [facility].

## 2021-03-30 NOTE — Consult Note (Signed)
Cardiology Consultation:   Patient ID: Anthony Estrada MRN: [id number]; DOB: 02/09/1976  Admit date: 03/29/2021 Date of Consult: 03/30/2021  PCP:  Marcine Matar, MD   [facility]  Cardiologist:  No primary care provider on file.  Advanced Practice Provider:  No care team member to display Electrophysiologist:  None   Click here to update Patient Care Team and Refresh Note - MD (PCP) or APP (Team Member)  Change PCP Type for MD, Specialty for APP is either Cardiology or Clinical Cardiac Electrophysiology  :[id number]    Patient Profile:   Anthony Estrada is a 45 y.o. male with a hx of HTN, tobacco use, cocaine use who is being seen today for the evaluation of NSTEMI at the request of hospital medicine.  History of Present Illness:   Anthony Estrada is a 45 year old gentleman with a history of hypertension, tobacco use, cocaine use who is presenting as a transfer for NSTEMI.  He began having intermittent shortness of breath yesterday while at work. No chest pain but he did feel diaphoretic.  Given progressive nature of his symptoms he presented to med [facility] emergency department.  There on evaluation initial work-up revealed troponin 80, chest x-ray unremarkable, CT PE without evidence of PE.  He had a mild elevation of his BNP and D-dimer was elevated.  On presentation he was no longer having chest pain.  However repeat troponin was elevated to 273.  Time his blood pressure was also markedly elevated.  He was transferred for ongoing management of NSTEMI.  Serial troponin revealed next troponin 904 and 1014.  He has been loaded with aspirin and started on heparin for anticoagulation.  Cardiology was called for consultation.  When I saw the patient overnight, he was resting comfortably in bed without symptoms at this time. Notes that this has never happened before. Has noted that he has been having some fatigue and exertional dyspnea over the last  several weeks but not major. Does admit to recent cocaine use 2 days ago but doesn't feel that symptoms were triggered by it.    Denies any fevers, chills, abdominal pain, lower extremity swelling, cough, or any other unusual symptoms.  No recent travel.  Past Medical History:  Diagnosis Date  . Hypertension     Past Surgical History:  Procedure Laterality Date  . no past surgery       Home Medications:  Prior to Admission medications   Medication Sig Start Date End Date Taking? Authorizing Provider  amLODipine (NORVASC) 10 MG tablet TAKE 1 TABLET (10 MG TOTAL) BY MOUTH DAILY. Patient taking differently: Take 10 mg by mouth daily. 02/11/21 02/11/22 Yes Hoy Register, MD  aspirin EC 325 MG tablet Take 1 tablet (325 mg total) by mouth daily. 08/29/20  Yes Zadie Rhine, MD    Inpatient Medications: Scheduled Meds: . aspirin  324 mg Oral NOW   Or  . aspirin  300 mg Rectal NOW  . [START ON 03/31/2021] aspirin EC  81 mg Oral Daily   Continuous Infusions: . sodium chloride 50 mL/hr at 03/30/21 0100  . heparin 1,150 Units/hr (03/30/21 0100)   PRN Meds: acetaminophen, nitroGLYCERIN, ondansetron (ZOFRAN) IV  Allergies:    Allergies  Allergen Reactions  . Codeine Itching  . Shellfish Allergy Swelling    Social History:   Social History   Socioeconomic History  . Marital status: Single    Spouse name: Not on file  . Number of children: Not on file  .  Years of education: Not on file  . Highest education level: Not on file  Occupational History  . Not on file  Tobacco Use  . Smoking status: Current Every Day Smoker    Packs/day: 1.00    Types: Cigars  . Smokeless tobacco: Never Used  Vaping Use  . Vaping Use: Never used  Substance and Sexual Activity  . Alcohol use: Yes    Comment: occasionally  . Drug use: Yes    Types: Cocaine    Comment: two days ago,  uses occasionally  . Sexual activity: Yes  Other Topics Concern  . Not on file  Social History Narrative   . Not on file   Social Determinants of Health   Financial Resource Strain: Not on file  Food Insecurity: Not on file  Transportation Needs: Not on file  Physical Activity: Not on file  Stress: Not on file  Social Connections: Not on file  Intimate Partner Violence: Not on file    Family History:    Family History  Problem Relation Age of Onset  . Hypertension Mother   . Diabetes Mother   . Hypertension Father   . Diabetes Father      ROS:  Please see the history of present illness.   All other ROS reviewed and negative.     Physical Exam/Data:   Vitals:   03/29/21 1630 03/29/21 1748 03/29/21 1920 03/29/21 2300  BP: (!) 153/99 139/86 140/79 135/73  Pulse: 73 68 80 83  Resp: 12 16  16   Temp:  98 F (36.7 C) 98.6 F (37 C) 98.4 F (36.9 C)  TempSrc:  Oral Oral Oral  SpO2: 100% 100% 100% 98%  Weight:  98.2 kg    Height:  5\' 4"  (1.626 m)      Intake/Output Summary (Last 24 hours) at 03/30/2021 0109 Last data filed at 03/30/2021 0100 Gross per 24 hour  Intake 1153.37 ml  Output --  Net 1153.37 ml   Last 3 Weights 03/29/2021 03/29/2021 02/12/2021  Weight (lbs) 216 lb 8 oz 223 lb 5.2 oz 224 lb  Weight (kg) 98.204 kg 101.3 kg 101.606 kg     Body mass index is 37.16 kg/m.  General:  Well nourished, well developed, in no acute distress HEENT: normal Lymph: no adenopathy Neck: no JVD Endocrine:  No thryomegaly Vascular: No carotid bruits; FA pulses 2+ bilaterally without bruits  Cardiac:  normal S1, S2; RRR; no murmur  Lungs:  clear to auscultation bilaterally, no wheezing, rhonchi or rales  Abd: soft, nontender, no hepatomegaly  Ext: no edema Musculoskeletal:  No deformities, BUE and BLE strength normal and equal Skin: warm and dry  Neuro:  CNs 2-12 intact, no focal abnormalities noted Psych:  Normal affect   EKG:  The EKG was personally reviewed and demonstrates:  Voltage criteria for LVH with repolarization abnormalities. Telemetry:  Telemetry was  personally reviewed and demonstrates:  Sinus rhythm.  Relevant CV Studies: none  Laboratory Data:  High Sensitivity Troponin:   Recent Labs  Lab 03/29/21 1046 03/29/21 1235 03/29/21 1557 03/29/21 1845  TROPONINIHS 80* 273* 904* 1,014*     Chemistry Recent Labs  Lab 03/29/21 1046  NA 136  K 3.0*  CL 105  CO2 24  GLUCOSE 101*  BUN 26*  CREATININE 1.86*  CALCIUM 8.8*  GFRNONAA 45*  ANIONGAP 7    Recent Labs  Lab 03/29/21 1046  PROT 6.6  ALBUMIN 3.4*  AST 21  ALT 13  ALKPHOS 45  BILITOT  0.4   Hematology Recent Labs  Lab 03/29/21 1046  WBC 6.3  RBC 4.31  HGB 13.2  HCT 39.3  MCV 91.2  MCH 30.6  MCHC 33.6  RDW 14.3  PLT 190   BNP Recent Labs  Lab 03/29/21 1046  BNP 216.6*    DDimer  Recent Labs  Lab 03/29/21 1046  DDIMER 1.10*     Radiology/Studies:  CT Angio Chest PE W and/or Wo Contrast  Result Date: 03/29/2021 CLINICAL DATA:  Shortness of breath EXAM: CT ANGIOGRAPHY CHEST WITH CONTRAST TECHNIQUE: Multidetector CT imaging of the chest was performed using the standard protocol during bolus administration of intravenous contrast. Multiplanar CT image reconstructions and MIPs were obtained to evaluate the vascular anatomy. CONTRAST:  OMNIPAQUE IOHEXOL 350 MG/ML SOLN COMPARISON:  Chest radiography same day.  CT 10/29/2020. FINDINGS: Cardiovascular: Heart size is normal. No coronary artery calcification. Very minimal thoracic aortic atherosclerotic calcification. Pulmonary arterial opacification is good. There are no pulmonary emboli. Mediastinum/Nodes: No mass or lymphadenopathy. Lungs/Pleura: Redemonstration of a 1.8 x 1.6 cm well-circumscribed nodule in the left upper lobe, stable since 11/22/2019. This is favored to represent a benign nodule. However, follow-up through December of 2022 is recommended as described previously to achieve 24 month follow-up. No pleural effusion. No second pulmonary parenchymal finding. Upper Abdomen: Normal  Musculoskeletal: No significant skeletal finding. Review of the MIP images confirms the above findings. IMPRESSION: 1. No pulmonary emboli or other acute chest pathology. 2. Redemonstration of a 1.8 x 1.6 cm well-circumscribed nodule in the left upper lobe, stable since 11/22/2019. This is favored to represent a benign nodule. Follow-up through December of 2022 suggested, as described previously, to achieve 24 month follow-up. 3. Aortic atherosclerosis. Aortic Atherosclerosis (ICD10-I70.0). Electronically Signed   By: Paulina Fusi M.D.   On: 03/29/2021 13:20   DG Chest Portable 1 View  Result Date: 03/29/2021 CLINICAL DATA:  Shortness of breath.  Chest pressure. EXAM: PORTABLE CHEST 1 VIEW COMPARISON:  August 28, 2020. FINDINGS: Mild enlargement the cardiac silhouette, likely accentuated by AP portable technique. No consolidation. Similar size of an approximately 1.6 cm left upper lobe for a nodule, better characterized on CT chest from November 29, 21. No visible pleural effusions or pneumothorax. No acute osseous abnormality. IMPRESSION: 1. No evidence of acute cardiopulmonary disease. 2. Similar size of an approximately 1.6 cm left upper lobe for a nodule, better characterized on CT chest from November 29, 21. Electronically Signed   By: Feliberto Harts MD   On: 03/29/2021 11:15     Assessment and Plan:   1. NSTEMI.  While it is likely that his substance abuse initially triggered this event, the ongoing symptoms and rapid rise of his troponin warrant treatment and evaluation as if acute coronary syndrome.  Agree with aspirin load and heparin for anticoagulation.  We will monitor his symptoms.  Recommend ordering an echo and will likely need to left heart catheterization/coronary angiography.  Still high likelihood that this is acute myocardial injury from coronary vasospasm however this can only be definitively proven with LHC.  No further testing or therapies overnight tonight.  2. Hypertension.   Seemed markedly elevated on presentation but normotensive on arrival here.  Again could be related to cocaine use.  Started on amlodipine.  Could also consider ACE inhibitor or ARB given chronic renal insufficiency.   Risk Assessment/Risk Scores:     TIMI Risk Score for Unstable Angina or Non-ST Elevation MI:   The patient's TIMI risk score is 2,  which indicates a 8% risk of all cause mortality, new or recurrent myocardial infarction or need for urgent revascularization in the next 14 days.          For questions or updates, please contact [facility] Please consult [email or web address] for contact info under    Signed, Joellen Jerseyennis Narcisse Jr, MD  03/30/2021 1:09 AM

## 2021-03-30 NOTE — Progress Notes (Signed)
EKG done and Cardiology paged with results.

## 2021-03-30 NOTE — Progress Notes (Signed)
PROGRESS NOTE                                                                                                                                                                                                             Patient Demographics:    Anthony Estrada, is a 45 y.o. male, DOB - [birth year], HAF:[id number]  Outpatient Primary MD for the patient is Marcine Matar, MD    LOS - 0  Admit date - 03/29/2021    Chief Complaint  Patient presents with  . Shortness of Breath       Brief Narrative (HPI from H&P)  - Anthony Estrada is a 45 y.o. male with medical history significant of HTN, smoking and cocaine use that is ongoing, last use 2 days ago, presented with chest pain and NSTEMI, +ve cocaine use.   Subjective:    Anthony Estrada today has, No headache, No chest pain, No abdominal pain - No Nausea, No new weakness tingling or numbness, no SOB.   Assessment  & Plan :     1. NSTEMI  - now pain free, likely due to cocaine abuse: Bladder well, cardiology managed by them.  He has been placed on heparin drip, Coreg & Crestor, pain free, LHC on 04/01/21.  2. Cocaine abuse.  Counseled to quit.  3. HTN - placed on Norvasc, Coreg & as needed IV Hydralazine.  4. AKI on CKD 3B -baseline creatinine around 1.7, AKI resolved, needed gentle hydration, back to baseline.   5.  Left upper lobe lung nodule.  Outpatient pulmonary follow-up with       Condition -   Guarded  Family Communication  : wife from pt's phone  Code Status :  Full  Consults  :  Cards  PUD Prophylaxis : None   Procedures  :     CTA -  1. No pulmonary emboli or other acute chest pathology. 2. Redemonstration of a 1.8 x 1.6 cm well-circumscribed nodule in the left upper lobe, stable since 11/22/2019. This is favored to represent a benign nodule. Follow-up through December of 2022 suggested, as described previously, to achieve 24 month follow-up. 3. Aortic  atherosclerosis.  TTE  L. Cath      Disposition Plan  :    Dispo: The patient is from: Home  Anticipated d/c is to: Home              Patient currently is not medically stable to d/c.   Difficult to place patient No  DVT Prophylaxis  :     Heparin   Lab Results  Component Value Date   PLT 183 03/30/2021    Diet :  Diet Order            Diet NPO time specified Except for: Sips with Meds  Diet effective midnight           Diet Heart Room service appropriate? Yes; Fluid consistency: Thin  Diet effective now                  Inpatient Medications  Scheduled Meds: . amLODipine  10 mg Oral Daily  . [START ON 03/31/2021] aspirin EC  81 mg Oral Daily  . carvedilol  6.25 mg Oral BID WC  . rosuvastatin  20 mg Oral Daily   Continuous Infusions: . [START ON 04/01/2021] sodium chloride     Followed by  . [START ON 04/01/2021] sodium chloride    . heparin 1,500 Units/hr (03/30/21 0705)   PRN Meds:.acetaminophen, hydrALAZINE, nitroGLYCERIN, ondansetron (ZOFRAN) IV  Antibiotics  :    Anti-infectives (From admission, onward)   None       Time Spent in minutes  30   Susa Raring M.D on 03/30/2021 at 11:10 AM  To page go to [email or web address]   [facility] -  Office  [phone]    See all Orders from today for further details    Objective:   Vitals:   03/29/21 1920 03/29/21 2300 03/30/21 0440 03/30/21 1046  BP: 140/79 135/73 (!) 152/107 (!) 156/99  Pulse: 80 83 95 78  Resp:  16 18   Temp: 98.6 F (37 C) 98.4 F (36.9 C) 98.2 F (36.8 C)   TempSrc: Oral Oral Oral   SpO2: 100% 98% 99%   Weight:   98 kg   Height:        Wt Readings from Last 3 Encounters:  03/30/21 98 kg  02/12/21 101.6 kg  11/02/20 101.6 kg     Intake/Output Summary (Last 24 hours) at 03/30/2021 1110 Last data filed at 03/30/2021 0444 Gross per 24 hour  Intake 1390.5 ml  Output --  Net 1390.5 ml     Physical Exam  Awake Alert, No new F.N deficits, Normal  affect Anthony Estrada.AT,PERRAL Supple Neck,No JVD, No cervical lymphadenopathy appriciated.  Symmetrical Chest wall movement, Good air movement bilaterally, CTAB RRR,No Gallops,Rubs or new Murmurs, No Parasternal Heave +ve B.Sounds, Abd Soft, No tenderness, No organomegaly appriciated, No rebound - guarding or rigidity. No Cyanosis, Clubbing or edema, No new Rash or bruise       Data Review:    CBC Recent Labs  Lab 03/29/21 1046 03/30/21 0434  WBC 6.3 4.2  HGB 13.2 13.6  HCT 39.3 40.3  PLT 190 183  MCV 91.2 91.4  MCH 30.6 30.8  MCHC 33.6 33.7  RDW 14.3 14.3  LYMPHSABS 2.2  --   MONOABS 0.8  --   EOSABS 0.2  --   BASOSABS 0.0  --     Recent Labs  Lab 03/29/21 1046 03/30/21 0434  NA 136 138  K 3.0* 3.9  CL 105 105  CO2 24 26  GLUCOSE 101* 89  BUN 26* 19  CREATININE 1.86* 1.75*  CALCIUM 8.8* 8.7*  AST 21  --   ALT 13  --  ALKPHOS 45  --   BILITOT 0.4  --   ALBUMIN 3.4*  --   DDIMER 1.10*  --   BNP 216.6*  --     ------------------------------------------------------------------------------------------------------------------ Recent Labs    03/30/21 0434  CHOL 145  HDL 46  LDLCALC 87  TRIG 62  CHOLHDL 3.2    Lab Results  Component Value Date   HGBA1C 5.3 09/13/2020   ------------------------------------------------------------------------------------------------------------------ No results for input(s): TSH, T4TOTAL, T3FREE, THYROIDAB in the last 72 hours.  Invalid input(s): FREET3  Cardiac Enzymes No results for input(s): CKMB, TROPONINI, MYOGLOBIN in the last 168 hours.  Invalid input(s): CK ------------------------------------------------------------------------------------------------------------------    Component Value Date/Time   BNP 216.6 (H) 03/29/2021 1046    Micro Results Recent Results (from the past 240 hour(s))  Resp Panel by RT-PCR (Flu A&B, Covid) Nasopharyngeal Swab     Status: None   Collection Time: 03/29/21 10:46 AM    Specimen: Nasopharyngeal Swab; Nasopharyngeal(NP) swabs in vial transport medium  Result Value Ref Range Status   SARS Coronavirus 2 by RT PCR NEGATIVE NEGATIVE Final    Comment: (NOTE) SARS-CoV-2 target nucleic acids are NOT DETECTED.  The SARS-CoV-2 RNA is generally detectable in upper respiratory specimens during the acute phase of infection. The lowest concentration of SARS-CoV-2 viral copies this assay can detect is 138 copies/mL. A negative result does not preclude SARS-Cov-2 infection and should not be used as the sole basis for treatment or other patient management decisions. A negative result may occur with  improper specimen collection/handling, submission of specimen other than nasopharyngeal swab, presence of viral mutation(s) within the areas targeted by this assay, and inadequate number of viral copies(<138 copies/mL). A negative result must be combined with clinical observations, patient history, and epidemiological information. The expected result is Negative.  Fact Sheet for Patients:  BloggerCourse.com  Fact Sheet for Healthcare Providers:  SeriousBroker.it  This test is no t yet approved or cleared by the [place] FDA and  has been authorized for detection and/or diagnosis of SARS-CoV-2 by FDA under an Emergency Use Authorization (EUA). This EUA will remain  in effect (meaning this test can be used) for the duration of the COVID-19 declaration under Section 564(b)(1) of the Act, 21 U.S.C.section 360bbb-3(b)(1), unless the authorization is terminated  or revoked sooner.       Influenza A by PCR NEGATIVE NEGATIVE Final   Influenza B by PCR NEGATIVE NEGATIVE Final    Comment: (NOTE) The Xpert Xpress SARS-CoV-2/FLU/RSV plus assay is intended as an aid in the diagnosis of influenza from Nasopharyngeal swab specimens and should not be used as a sole basis for treatment. Nasal washings and aspirates are  unacceptable for Xpert Xpress SARS-CoV-2/FLU/RSV testing.  Fact Sheet for Patients: BloggerCourse.com  Fact Sheet for Healthcare Providers: SeriousBroker.it  This test is not yet approved or cleared by the [place] FDA and has been authorized for detection and/or diagnosis of SARS-CoV-2 by FDA under an Emergency Use Authorization (EUA). This EUA will remain in effect (meaning this test can be used) for the duration of the COVID-19 declaration under Section 564(b)(1) of the Act, 21 U.S.C. section 360bbb-3(b)(1), unless the authorization is terminated or revoked.  Performed at [facility], [address] [place], [place] [address]     Radiology Reports CT Angio Chest PE W and/or Wo Contrast  Result Date: 03/29/2021 CLINICAL DATA:  Shortness of breath EXAM: CT ANGIOGRAPHY CHEST WITH CONTRAST TECHNIQUE: Multidetector CT imaging of the chest was performed using  the standard protocol during bolus administration of intravenous contrast. Multiplanar CT image reconstructions and MIPs were obtained to evaluate the vascular anatomy. CONTRAST:  100mL OMNIPAQUE IOHEXOL 350 MG/ML SOLN COMPARISON:  Chest radiography same day.  CT 10/29/2020. FINDINGS: Cardiovascular: Heart size is normal. No coronary artery calcification. Very minimal thoracic aortic atherosclerotic calcification. Pulmonary arterial opacification is good. There are no pulmonary emboli. Mediastinum/Nodes: No mass or lymphadenopathy. Lungs/Pleura: Redemonstration of a 1.8 x 1.6 cm well-circumscribed nodule in the left upper lobe, stable since 11/22/2019. This is favored to represent a benign nodule. However, follow-up through December of 2022 is recommended as described previously to achieve 24 month follow-up. No pleural effusion. No second pulmonary parenchymal finding. Upper Abdomen: Normal Musculoskeletal: No significant skeletal finding. Review of the MIP images confirms  the above findings. IMPRESSION: 1. No pulmonary emboli or other acute chest pathology. 2. Redemonstration of a 1.8 x 1.6 cm well-circumscribed nodule in the left upper lobe, stable since 11/22/2019. This is favored to represent a benign nodule. Follow-up through December of 2022 suggested, as described previously, to achieve 24 month follow-up. 3. Aortic atherosclerosis. Aortic Atherosclerosis (ICD10-I70.0). Electronically Signed   By: Paulina FusiMark  Shogry M.D.   On: 03/29/2021 13:20   DG Chest Portable 1 View  Result Date: 03/29/2021 CLINICAL DATA:  Shortness of breath.  Chest pressure. EXAM: PORTABLE CHEST 1 VIEW COMPARISON:  August 28, 2020. FINDINGS: Mild enlargement the cardiac silhouette, likely accentuated by AP portable technique. No consolidation. Similar size of an approximately 1.6 cm left upper lobe for a nodule, better characterized on CT chest from November 29, 21. No visible pleural effusions or pneumothorax. No acute osseous abnormality. IMPRESSION: 1. No evidence of acute cardiopulmonary disease. 2. Similar size of an approximately 1.6 cm left upper lobe for a nodule, better characterized on CT chest from November 29, 21. Electronically Signed   By: Feliberto HartsFrederick S Jones MD   On: 03/29/2021 11:15

## 2021-03-30 NOTE — Progress Notes (Addendum)
ANTICOAGULATION CONSULT NOTE  Pharmacy Consult for Heparin Indication: NSTEMI  Allergies  Allergen Reactions  . Codeine Itching  . Shellfish Allergy Swelling    Patient Measurements: Height: 5\' 4"  (162.6 cm) Weight: 98 kg (216 lb 1.6 oz) IBW/kg (Calculated) : 59.2 Heparin Dosing Weight: 81.3 kg  Vital Signs: Temp: 98 F (36.7 C) (04/30 1330) Temp Source: Oral (04/30 1330) BP: 132/71 (04/30 1330) Pulse Rate: 78 (04/30 1330)  Labs: Recent Labs    03/29/21 1046 03/29/21 1235 03/29/21 1557 03/29/21 1845 03/30/21 0434 03/30/21 0616 03/30/21 1311  HGB 13.2  --   --   --  13.6  --   --   HCT 39.3  --   --   --  40.3  --   --   PLT 190  --   --   --  183  --   --   HEPARINUNFRC  --   --   --   --   --  0.16* 0.21*  CREATININE 1.86*  --   --   --  1.75*  --   --   TROPONINIHS 80* 273* 904* 1,014*  --   --   --     Estimated Creatinine Clearance: 56.3 mL/min (A) (by C-G formula based on SCr of 1.75 mg/dL (H)).   Medical History: Past Medical History:  Diagnosis Date  . Hypertension     Medications:  Infusions:  . [START ON 04/01/2021] sodium chloride     Followed by  . [START ON 04/01/2021] sodium chloride    . heparin 1,500 Units/hr (03/30/21 04/01/21)    Assessment: 45 yo male with NSTEMI, pharmacy consulted to dose heparin. Plans are for left heart catheterization Monday.  Heparin level remains subtherapeutic despite rate increase to 1500 units/h. No issues with infusion today or signs of bleeding per RN. No chest pain today. CBC wnl/stable.  Goal of Therapy:  Heparin level 0.3-0.7 units/ml Monitor platelets by anticoagulation protocol: Yes  Plan:  Increase IV heparin to 1750 units/hr 6h heparin level Daily heparin level, CBC while on heparin Monitor s/s bleeding Plan for [facility] Monday   Sunday, PharmD PGY1 Pharmacy Resident 03/30/2021 2:15 PM  Please check [email or web address] for unit-specific pharmacy phone numbers.

## 2021-03-30 NOTE — H&P (Signed)
History and Physical    Anthony Estrada ASN:[id number] DOB: 1976-11-09 DOA: 03/29/2021  PCP: Marcine Matar, MD  Patient coming from: Home  I have personally briefly reviewed patient's old medical records in [facility]  Chief Complaint: SOB  HPI: Anthony Estrada is a 45 y.o. male with medical history significant of HTN, smoking and cocaine use that is ongoing, last use 2 days ago.  Pt presents to ED at [facility] with c/o intermittent shortness of breath while at work yesterday.  No CP but did have diaphoresis.  Symptoms progressively worsened.  Presented to ED for worsening symptoms.  No fevers, chills.   ED Course: CP free.  Trops: 80, 273, 904 and 1014.  CTA chest neg for PE.  Creat 1.8 - looks like this might be baseline though.  Transferred to [facility] for admit.   Review of Systems: As per HPI, otherwise all review of systems negative.  Past Medical History:  Diagnosis Date  . Hypertension     Past Surgical History:  Procedure Laterality Date  . no past surgery       reports that he has been smoking cigars. He has been smoking about 1.00 pack per day. He has never used smokeless tobacco. He reports current alcohol use. He reports current drug use. Drug: Cocaine.  Allergies  Allergen Reactions  . Codeine Itching  . Shellfish Allergy Swelling    Family History  Problem Relation Age of Onset  . Hypertension Mother   . Diabetes Mother   . Hypertension Father   . Diabetes Father      Prior to Admission medications   Medication Sig Start Date End Date Taking? Authorizing Provider  amLODipine (NORVASC) 10 MG tablet TAKE 1 TABLET (10 MG TOTAL) BY MOUTH DAILY. Patient taking differently: Take 10 mg by mouth daily. 02/11/21 02/11/22 Yes Hoy Register, MD  aspirin EC 325 MG tablet Take 1 tablet (325 mg total) by mouth daily. 08/29/20  Yes Zadie Rhine, MD    Physical Exam: Vitals:   03/29/21 1630 03/29/21 1748 03/29/21 1920 03/29/21 2300  BP: (!)  153/99 139/86 140/79 135/73  Pulse: 73 68 80 83  Resp: 12 16  16   Temp:  98 F (36.7 C) 98.6 F (37 C) 98.4 F (36.9 C)  TempSrc:  Oral Oral Oral  SpO2: 100% 100% 100% 98%  Weight:  98.2 kg    Height:  5\' 4"  (1.626 m)      Constitutional: NAD, calm, comfortable Eyes: PERRL, lids and conjunctivae normal ENMT: Mucous membranes are moist. Posterior pharynx clear of any exudate or lesions.Normal dentition.  Neck: normal, supple, no masses, no thyromegaly Respiratory: clear to auscultation bilaterally, no wheezing, no crackles. Normal respiratory effort. No accessory muscle use.  Cardiovascular: Regular rate and rhythm, no murmurs / rubs / gallops. No extremity edema. 2+ pedal pulses. No carotid bruits.  Abdomen: no tenderness, no masses palpated. No hepatosplenomegaly. Bowel sounds positive.  Musculoskeletal: no clubbing / cyanosis. No joint deformity upper and lower extremities. Good ROM, no contractures. Normal muscle tone.  Skin: no rashes, lesions, ulcers. No induration Neurologic: CN 2-12 grossly intact. Sensation intact, DTR normal. Strength 5/5 in all 4.  Psychiatric: Normal judgment and insight. Alert and oriented x 3. Normal mood.    Labs on Admission: I have personally reviewed following labs and imaging studies  CBC: Recent Labs  Lab 03/29/21 1046  WBC 6.3  NEUTROABS 3.2  HGB 13.2  HCT 39.3  MCV 91.2  PLT 190  Basic Metabolic Panel: Recent Labs  Lab 03/29/21 1046  NA 136  K 3.0*  CL 105  CO2 24  GLUCOSE 101*  BUN 26*  CREATININE 1.86*  CALCIUM 8.8*   GFR: Estimated Creatinine Clearance: 53.1 mL/min (A) (by C-G formula based on SCr of 1.86 mg/dL (H)). Liver Function Tests: Recent Labs  Lab 03/29/21 1046  AST 21  ALT 13  ALKPHOS 45  BILITOT 0.4  PROT 6.6  ALBUMIN 3.4*   No results for input(s): LIPASE, AMYLASE in the last 168 hours. No results for input(s): AMMONIA in the last 168 hours. Coagulation Profile: No results for input(s): INR,  PROTIME in the last 168 hours. Cardiac Enzymes: No results for input(s): CKTOTAL, CKMB, CKMBINDEX, TROPONINI in the last 168 hours. BNP (last 3 results) No results for input(s): PROBNP in the last 8760 hours. HbA1C: No results for input(s): HGBA1C in the last 72 hours. CBG: No results for input(s): GLUCAP in the last 168 hours. Lipid Profile: No results for input(s): CHOL, HDL, LDLCALC, TRIG, CHOLHDL, LDLDIRECT in the last 72 hours. Thyroid Function Tests: No results for input(s): TSH, T4TOTAL, FREET4, T3FREE, THYROIDAB in the last 72 hours. Anemia Panel: No results for input(s): VITAMINB12, FOLATE, FERRITIN, TIBC, IRON, RETICCTPCT in the last 72 hours. Urine analysis:    Component Value Date/Time   COLORURINE YELLOW 08/29/2020 0232   APPEARANCEUR CLEAR 08/29/2020 0232   LABSPEC >1.030 (H) 08/29/2020 0232   PHURINE 5.5 08/29/2020 0232   GLUCOSEU NEGATIVE 08/29/2020 0232   HGBUR NEGATIVE 08/29/2020 0232   BILIRUBINUR NEGATIVE 08/29/2020 0232   KETONESUR NEGATIVE 08/29/2020 0232   PROTEINUR NEGATIVE 08/29/2020 0232   UROBILINOGEN 1.0 08/09/2010 0212   NITRITE NEGATIVE 08/29/2020 0232   LEUKOCYTESUR NEGATIVE 08/29/2020 0232    Radiological Exams on Admission: CT Angio Chest PE W and/or Wo Contrast  Result Date: 03/29/2021 CLINICAL DATA:  Shortness of breath EXAM: CT ANGIOGRAPHY CHEST WITH CONTRAST TECHNIQUE: Multidetector CT imaging of the chest was performed using the standard protocol during bolus administration of intravenous contrast. Multiplanar CT image reconstructions and MIPs were obtained to evaluate the vascular anatomy. CONTRAST:  OMNIPAQUE IOHEXOL 350 MG/ML SOLN COMPARISON:  Chest radiography same day.  CT 10/29/2020. FINDINGS: Cardiovascular: Heart size is normal. No coronary artery calcification. Very minimal thoracic aortic atherosclerotic calcification. Pulmonary arterial opacification is good. There are no pulmonary emboli. Mediastinum/Nodes: No mass or  lymphadenopathy. Lungs/Pleura: Redemonstration of a 1.8 x 1.6 cm well-circumscribed nodule in the left upper lobe, stable since 11/22/2019. This is favored to represent a benign nodule. However, follow-up through December of 2022 is recommended as described previously to achieve 24 month follow-up. No pleural effusion. No second pulmonary parenchymal finding. Upper Abdomen: Normal Musculoskeletal: No significant skeletal finding. Review of the MIP images confirms the above findings. IMPRESSION: 1. No pulmonary emboli or other acute chest pathology. 2. Redemonstration of a 1.8 x 1.6 cm well-circumscribed nodule in the left upper lobe, stable since 11/22/2019. This is favored to represent a benign nodule. Follow-up through December of 2022 suggested, as described previously, to achieve 24 month follow-up. 3. Aortic atherosclerosis. Aortic Atherosclerosis (ICD10-I70.0). Electronically Signed   By: Paulina Fusi M.D.   On: 03/29/2021 13:20   DG Chest Portable 1 View  Result Date: 03/29/2021 CLINICAL DATA:  Shortness of breath.  Chest pressure. EXAM: PORTABLE CHEST 1 VIEW COMPARISON:  August 28, 2020. FINDINGS: Mild enlargement the cardiac silhouette, likely accentuated by AP portable technique. No consolidation. Similar size of an approximately 1.6 cm left  upper lobe for a nodule, better characterized on CT chest from November 29, 21. No visible pleural effusions or pneumothorax. No acute osseous abnormality. IMPRESSION: 1. No evidence of acute cardiopulmonary disease. 2. Similar size of an approximately 1.6 cm left upper lobe for a nodule, better characterized on CT chest from November 29, 21. Electronically Signed   By: Feliberto Harts MD   On: 03/29/2021 11:15    EKG: Independently reviewed.  Assessment/Plan Principal Problem:   NSTEMI (non-ST elevated myocardial infarction) (HCC) Active Problems:   Stage 3 chronic kidney disease (HCC)    1. NSTEMI - cocaine vasospasm vs ACS 1. NSTEMI  pathway 2. ASA 3. Cards consult: 1. 2D echo 2. Likely will need LHC 4. Heparin gtt 5. Tele monitor 6. NPO for now until cards decides on timing of LHC 1. LR at 100 7. Check FLP 2. CKD 3 - 1. Looks like creat runs 1.6-1.8 at baseline. 2. Repeat BMP in AM since he just got IVC dye for CTA PE 3. HTN - 1. Cont amlodipine  DVT prophylaxis: Heparin gtt Code Status: Full Family Communication: No family in room Disposition Plan: Home after cleared by cards Consults called: Cardiology Admission status: Place in obs - likely convert to IP later today    Laurana Magistro M. DO [facility]  How to contact the [facility] Attending or Consulting provider 7A - 7P or covering provider during after hours 7P -7A, for this patient?  1. Check the care team in [facility] and look for a) attending/consulting [facility] provider listed and b) the [facility] team listed 2. Log into [email or web address]  Amion Physician Scheduling and messaging for groups and whole hospitals  On call and physician scheduling software for group practices, residents, hospitalists and other medical providers for call, clinic, rotation and shift schedules. On[employer] is a hospital-wide system for scheduling doctors and paging doctors on call. EasyPlot is for scientific plotting and data analysis.  [email or web address]  and use [place]'s universal password to access. If you do not have the password, please contact the hospital operator.  3. Locate the [facility] provider you are looking for under [facility] and page to a number that you can be directly reached. 4. If you still have difficulty reaching the provider, please page the [facility] (Director on Call) for the Hospitalists listed on amion for assistance.  03/30/2021, 2:10 AM

## 2021-03-30 NOTE — Progress Notes (Signed)
  Echocardiogram 2D Echocardiogram has been performed.  Anthony Estrada 03/30/2021, 11:58 AM

## 2021-03-30 NOTE — Progress Notes (Signed)
[facility] night shift.  The patient's troponin has been steadily rising.  Aspirin 324 mg chewable form x1 and Heparin per pharmacy ordered.  Cardiology has been consulted and will evaluate the patient.  Sanda Klein, MD.

## 2021-03-31 DIAGNOSIS — I214 Non-ST elevation (NSTEMI) myocardial infarction: Secondary | ICD-10-CM

## 2021-03-31 LAB — COMPREHENSIVE METABOLIC PANEL
ALT: 12 U/L (ref 0–44)
AST: 17 U/L (ref 15–41)
Albumin: 3.3 g/dL — ABNORMAL LOW (ref 3.5–5.0)
Alkaline Phosphatase: 43 U/L (ref 38–126)
Anion gap: 7 (ref 5–15)
BUN: 20 mg/dL (ref 6–20)
CO2: 29 mmol/L (ref 22–32)
Calcium: 8.8 mg/dL — ABNORMAL LOW (ref 8.9–10.3)
Chloride: 103 mmol/L (ref 98–111)
Creatinine, Ser: 1.85 mg/dL — ABNORMAL HIGH (ref 0.61–1.24)
GFR, Estimated: 45 mL/min — ABNORMAL LOW (ref >60)
Glucose, Bld: 121 mg/dL — ABNORMAL HIGH (ref 70–99)
Potassium: 4 mmol/L (ref 3.5–5.1)
Sodium: 139 mmol/L (ref 135–145)
Total Bilirubin: 0.4 mg/dL (ref 0.3–1.2)
Total Protein: 5.8 g/dL — ABNORMAL LOW (ref 6.5–8.1)

## 2021-03-31 LAB — CBC WITH DIFFERENTIAL/PLATELET
Abs Immature Granulocytes: 0.01 10*3/uL (ref 0.00–0.07)
Basophils Absolute: 0 10*3/uL (ref 0.0–0.1)
Basophils Relative: 1 %
Eosinophils Absolute: 0.2 10*3/uL (ref 0.0–0.5)
Eosinophils Relative: 4 %
HCT: 39.4 % (ref 39.0–52.0)
Hemoglobin: 13.2 g/dL (ref 13.0–17.0)
Immature Granulocytes: 0 %
Lymphocytes Relative: 48 %
Lymphs Abs: 2.3 10*3/uL (ref 0.7–4.0)
MCH: 31.4 pg (ref 26.0–34.0)
MCHC: 33.5 g/dL (ref 30.0–36.0)
MCV: 93.6 fL (ref 80.0–100.0)
Monocytes Absolute: 0.5 10*3/uL (ref 0.1–1.0)
Monocytes Relative: 11 %
Neutro Abs: 1.8 10*3/uL (ref 1.7–7.7)
Neutrophils Relative %: 36 %
Platelets: 195 10*3/uL (ref 150–400)
RBC: 4.21 MIL/uL — ABNORMAL LOW (ref 4.22–5.81)
RDW: 14.1 % (ref 11.5–15.5)
WBC: 4.8 10*3/uL (ref 4.0–10.5)
nRBC: 0 % (ref 0.0–0.2)

## 2021-03-31 LAB — BRAIN NATRIURETIC PEPTIDE: B Natriuretic Peptide: 151.7 pg/mL — ABNORMAL HIGH (ref 0.0–100.0)

## 2021-03-31 LAB — MAGNESIUM: Magnesium: 2.2 mg/dL (ref 1.7–2.4)

## 2021-03-31 LAB — HEPARIN LEVEL (UNFRACTIONATED): Heparin Unfractionated: 0.57 IU/mL (ref 0.30–0.70)

## 2021-03-31 MED ORDER — ANGIOPLASTY BOOK
Freq: Once | Status: AC
  Filled 2021-03-31: qty 1

## 2021-03-31 NOTE — Progress Notes (Signed)
ANTICOAGULATION CONSULT NOTE  Pharmacy Consult for Heparin Indication: NSTEMI  Allergies  Allergen Reactions  . Codeine Itching  . Shellfish Allergy Swelling    Patient Measurements: Height: 5\' 4"  (162.6 cm) Weight: 98 kg (216 lb 1.6 oz) IBW/kg (Calculated) : 59.2 Heparin Dosing Weight: 81.3 kg  Vital Signs: Temp: 99 F (37.2 C) (04/30 2010) Temp Source: Oral (04/30 2010) BP: 135/73 (04/30 2010) Pulse Rate: 65 (04/30 2010)  Labs: Recent Labs    03/29/21 1046 03/29/21 1235 03/29/21 1557 03/29/21 1845 03/30/21 0434 03/30/21 0616 03/30/21 1311 03/30/21 2105 03/31/21 0124  HGB 13.2  --   --   --  13.6  --   --   --  13.2  HCT 39.3  --   --   --  40.3  --   --   --  39.4  PLT 190  --   --   --  183  --   --   --  195  HEPARINUNFRC  --   --   --   --   --    < > 0.21* 0.49 0.57  CREATININE 1.86*  --   --   --  1.75*  --   --   --  1.85*  TROPONINIHS 80* 273* 904* 1,014*  --   --   --   --   --    < > = values in this interval not displayed.    Estimated Creatinine Clearance: 53.3 mL/min (A) (by C-G formula based on SCr of 1.85 mg/dL (H)).   Medical History: Past Medical History:  Diagnosis Date  . Hypertension     Medications:  Infusions:  . [START ON 04/01/2021] sodium chloride     Followed by  . [START ON 04/01/2021] sodium chloride    . heparin 1,750 Units/hr (03/31/21 0500)    Assessment: 45 yo male with NSTEMI, pharmacy consulted to dose heparin. Plans are for left heart catheterization Monday.  Heparin level remains therapeutic on rate at 1750 units/hr. No bleeding noted. CBC wnl/stable.  Goal of Therapy:  Heparin level 0.3-0.7 units/ml Monitor platelets by anticoagulation protocol: Yes  Plan:  Continue IV heparin to 1750 units/hr Daily heparin level, CBC while on heparin Monitor s/s bleeding Plan for [facility] Monday   Wednesday, PharmD PGY1 Pharmacy Resident 03/31/2021 7:34 AM  Please check [email or web address] for unit-specific pharmacy phone  numbers.

## 2021-03-31 NOTE — Progress Notes (Signed)
Progress Note  Patient Name: Anthony Estrada Date of Encounter: 03/31/2021  [facility] Cardiologist: No primary care provider on file.   Subjective   Currently feeling well.  No chest pain or shortness of breath.  Inpatient Medications    Scheduled Meds: . amLODipine  10 mg Oral Daily  . aspirin EC  81 mg Oral Daily  . carvedilol  6.25 mg Oral BID WC  . rosuvastatin  20 mg Oral Daily   Continuous Infusions: . [START ON 04/01/2021] sodium chloride     Followed by  . [START ON 04/01/2021] sodium chloride    . heparin 1,750 Units/hr (03/31/21 0500)   PRN Meds: acetaminophen, hydrALAZINE, nitroGLYCERIN, ondansetron (ZOFRAN) IV   Vital Signs    Vitals:   03/30/21 1330 03/30/21 1624 03/30/21 1758 03/30/21 2010  BP: 132/71 (!) 148/85 (!) 146/93 135/73  Pulse: 78 62 98 65  Resp: 20 20  18   Temp: 98 F (36.7 C) 98.6 F (37 C)  99 F (37.2 C)  TempSrc: Oral Oral  Oral  SpO2:    99%  Weight:      Height:        Intake/Output Summary (Last 24 hours) at 03/31/2021 0926 Last data filed at 03/31/2021 0500 Gross per 24 hour  Intake 1399.98 ml  Output --  Net 1399.98 ml   Last 3 Weights 03/30/2021 03/29/2021 03/29/2021  Weight (lbs) 216 lb 1.6 oz 216 lb 8 oz 223 lb 5.2 oz  Weight (kg) 98.022 kg 98.204 kg 101.3 kg      Telemetry    Sinus rhythm-Personally reviewed  ECG    Sinus rhythm, LVH, diffuse T wave inversions-personally reviewed  Physical Exam   GEN: Well nourished, well developed, in no acute distress  HEENT: normal  Neck: no JVD, carotid bruits, or masses Cardiac: RRR; no murmurs, rubs, or gallops,no edema  Respiratory:  clear to auscultation bilaterally, normal work of breathing GI: soft, nontender, nondistended, + BS MS: no deformity or atrophy  Skin: warm and dry Neuro:  Strength and sensation are intact Psych: euthymic mood, full affect   Labs    High Sensitivity Troponin:   Recent Labs  Lab 03/29/21 1046 03/29/21 1235 03/29/21 1557  03/29/21 1845  TROPONINIHS 80* 273* 904* 1,014*      Chemistry Recent Labs  Lab 03/29/21 1046 03/30/21 0434 03/31/21 0124  NA 136 138 139  K 3.0* 3.9 4.0  CL 105 105 103  CO2 24 26 29   GLUCOSE 101* 89 121*  BUN 26* 19 20  CREATININE 1.86* 1.75* 1.85*  CALCIUM 8.8* 8.7* 8.8*  PROT 6.6  --  5.8*  ALBUMIN 3.4*  --  3.3*  AST 21  --  17  ALT 13  --  12  ALKPHOS 45  --  43  BILITOT 0.4  --  0.4  GFRNONAA 45* 48* 45*  ANIONGAP 7 7 7      Hematology Recent Labs  Lab 03/29/21 1046 03/30/21 0434 03/31/21 0124  WBC 6.3 4.2 4.8  RBC 4.31 4.41 4.21*  HGB 13.2 13.6 13.2  HCT 39.3 40.3 39.4  MCV 91.2 91.4 93.6  MCH 30.6 30.8 31.4  MCHC 33.6 33.7 33.5  RDW 14.3 14.3 14.1  PLT 190 183 195    BNP Recent Labs  Lab 03/29/21 1046 03/31/21 0124  BNP 216.6* 151.7*     DDimer  Recent Labs  Lab 03/29/21 1046  DDIMER 1.10*     Radiology    CT Angio Chest  PE W and/or Wo Contrast  Result Date: 03/29/2021 CLINICAL DATA:  Shortness of breath EXAM: CT ANGIOGRAPHY CHEST WITH CONTRAST TECHNIQUE: Multidetector CT imaging of the chest was performed using the standard protocol during bolus administration of intravenous contrast. Multiplanar CT image reconstructions and MIPs were obtained to evaluate the vascular anatomy. CONTRAST:  OMNIPAQUE IOHEXOL 350 MG/ML SOLN COMPARISON:  Chest radiography same day.  CT 10/29/2020. FINDINGS: Cardiovascular: Heart size is normal. No coronary artery calcification. Very minimal thoracic aortic atherosclerotic calcification. Pulmonary arterial opacification is good. There are no pulmonary emboli. Mediastinum/Nodes: No mass or lymphadenopathy. Lungs/Pleura: Redemonstration of a 1.8 x 1.6 cm well-circumscribed nodule in the left upper lobe, stable since 11/22/2019. This is favored to represent a benign nodule. However, follow-up through December of 2022 is recommended as described previously to achieve 24 month follow-up. No pleural effusion. No  second pulmonary parenchymal finding. Upper Abdomen: Normal Musculoskeletal: No significant skeletal finding. Review of the MIP images confirms the above findings. IMPRESSION: 1. No pulmonary emboli or other acute chest pathology. 2. Redemonstration of a 1.8 x 1.6 cm well-circumscribed nodule in the left upper lobe, stable since 11/22/2019. This is favored to represent a benign nodule. Follow-up through December of 2022 suggested, as described previously, to achieve 24 month follow-up. 3. Aortic atherosclerosis. Aortic Atherosclerosis (ICD10-I70.0). Electronically Signed   By: Paulina Fusi M.D.   On: 03/29/2021 13:20   DG Chest Portable 1 View  Result Date: 03/29/2021 CLINICAL DATA:  Shortness of breath.  Chest pressure. EXAM: PORTABLE CHEST 1 VIEW COMPARISON:  August 28, 2020. FINDINGS: Mild enlargement the cardiac silhouette, likely accentuated by AP portable technique. No consolidation. Similar size of an approximately 1.6 cm left upper lobe for a nodule, better characterized on CT chest from November 29, 21. No visible pleural effusions or pneumothorax. No acute osseous abnormality. IMPRESSION: 1. No evidence of acute cardiopulmonary disease. 2. Similar size of an approximately 1.6 cm left upper lobe for a nodule, better characterized on CT chest from November 29, 21. Electronically Signed   By: Feliberto Harts MD   On: 03/29/2021 11:15   ECHOCARDIOGRAM COMPLETE  Result Date: 03/30/2021    ECHOCARDIOGRAM REPORT   Patient Name:   Anthony Estrada Date of Exam: 03/30/2021 Medical Rec #:  [id number]        Height:       64.0 in Accession #:    [id number]       Weight:       216.1 lb Date of Birth:  1976/07/26         BSA:          2.022 m Patient Age:    45 years         BP:           156/99 mmHg Patient Gender: M                HR:           78 bpm. Exam Location:  Inpatient Procedure: 2D Echo, Cardiac Doppler and Color Doppler Indications:    Chest pain  History:        Patient has no prior history of  Echocardiogram examinations.                 Signs/Symptoms:Shortness of Breath; Risk Factors:Hypertension                 and Dyslipidemia. Cocaine abuse.  Sonographer:    Ross Ludwig RDCS (AE) Referring Phys:  [id number] MICHAEL E NORINS IMPRESSIONS  1. Left ventricular ejection fraction, by estimation, is 65 to 70%. The left ventricle has normal function. The left ventricle has no regional wall motion abnormalities. There is moderate left ventricular hypertrophy. Left ventricular diastolic parameters are consistent with Grade I diastolic dysfunction (impaired relaxation). Elevated left ventricular end-diastolic pressure. The E/e' is 15.  2. Right ventricular systolic function is normal. The right ventricular size is normal. Tricuspid regurgitation signal is inadequate for assessing PA pressure.  3. Left atrial size was moderately dilated.  4. The mitral valve is grossly normal. Trivial mitral valve regurgitation.  5. The aortic valve is tricuspid. Aortic valve regurgitation is trivial. Mild aortic valve sclerosis is present, with no evidence of aortic valve stenosis.  6. The inferior vena cava is normal in size with <50% respiratory variability, suggesting right atrial pressure of 8 mmHg. Comparison(s): No prior Echocardiogram. FINDINGS  Left Ventricle: Left ventricular ejection fraction, by estimation, is 65 to 70%. The left ventricle has normal function. The left ventricle has no regional wall motion abnormalities. The left ventricular internal cavity size was normal in size. There is  moderate left ventricular hypertrophy. Left ventricular diastolic parameters are consistent with Grade I diastolic dysfunction (impaired relaxation). Elevated left ventricular end-diastolic pressure. The E/e' is 15. Right Ventricle: The right ventricular size is normal. No increase in right ventricular wall thickness. Right ventricular systolic function is normal. Tricuspid regurgitation signal is inadequate for assessing PA pressure.  Left Atrium: Left atrial size was moderately dilated. Right Atrium: Right atrial size was normal in size. Pericardium: There is no evidence of pericardial effusion. Mitral Valve: The mitral valve is grossly normal. Trivial mitral valve regurgitation. MV peak gradient, 4.5 mmHg. The mean mitral valve gradient is 2.0 mmHg. Tricuspid Valve: The tricuspid valve is grossly normal. Tricuspid valve regurgitation is trivial. Aortic Valve: The aortic valve is tricuspid. Aortic valve regurgitation is trivial. Mild aortic valve sclerosis is present, with no evidence of aortic valve stenosis. Aortic valve mean gradient measures 8.0 mmHg. Aortic valve peak gradient measures 15.1 mmHg. Aortic valve area, by VTI measures 2.50 cm. Pulmonic Valve: The pulmonic valve was grossly normal. Pulmonic valve regurgitation is not visualized. Aorta: The aortic root and ascending aorta are structurally normal, with no evidence of dilitation. Venous: The inferior vena cava is normal in size with less than 50% respiratory variability, suggesting right atrial pressure of 8 mmHg. IAS/Shunts: No atrial level shunt detected by color flow Doppler.  LEFT VENTRICLE PLAX 2D LVIDd:         5.10 cm  Diastology LVIDs:         2.90 cm  LV e' medial:    5.77 cm/s LV PW:         1.70 cm  LV E/e' medial:  15.0 LV IVS:        1.50 cm  LV e' lateral:   5.77 cm/s LVOT diam:     2.00 cm  LV E/e' lateral: 15.0 LV SV:         85 LV SV Index:   42 LVOT Area:     3.14 cm  RIGHT VENTRICLE             IVC RV S prime:     16.30 cm/s  IVC diam: 1.60 cm TAPSE (M-mode): 3.1 cm LEFT ATRIUM             Index       RIGHT ATRIUM  Index LA diam:        4.30 cm 2.13 cm/m  RA Area:     20.60 cm LA Vol (A2C):   76.9 ml 38.04 ml/m RA Volume:   57.10 ml  28.24 ml/m LA Vol (A4C):   80.1 ml 39.62 ml/m LA Biplane Vol: 82.4 ml 40.76 ml/m  AORTIC VALVE AV Area (Vmax):    2.46 cm AV Area (Vmean):   2.27 cm AV Area (VTI):     2.50 cm AV Vmax:           194.00 cm/s AV  Vmean:          132.000 cm/s AV VTI:            0.339 m AV Peak Grad:      15.1 mmHg AV Mean Grad:      8.0 mmHg LVOT Vmax:         152.00 cm/s LVOT Vmean:        95.200 cm/s LVOT VTI:          0.270 m LVOT/AV VTI ratio: 0.80  AORTA Ao Root diam: 3.50 cm Ao Asc diam:  3.20 cm MITRAL VALVE MV Area (PHT): 3.12 cm    SHUNTS MV Area VTI:   2.37 cm    Systemic VTI:  0.27 m MV Peak grad:  4.5 mmHg    Systemic Diam: 2.00 cm MV Mean grad:  2.0 mmHg MV Vmax:       1.06 m/s MV Vmean:      68.7 cm/s MV Decel Time: 243 msec MV E velocity: 86.50 cm/s MV A velocity: 68.10 cm/s MV E/A ratio:  1.27 Zoila ShutterKenneth Hilty MD Electronically signed by Zoila ShutterKenneth Hilty MD Signature Date/Time: 03/30/2021/12:23:28 PM    Final     Cardiac Studies   TTE 1. Left ventricular ejection fraction, by estimation, is 65 to 70%. The  left ventricle has normal function. The left ventricle has no regional  wall motion abnormalities. There is moderate left ventricular hypertrophy.  Left ventricular diastolic  parameters are consistent with Grade I diastolic dysfunction (impaired  relaxation). Elevated left ventricular end-diastolic pressure. The E/e' is  15.  2. Right ventricular systolic function is normal. The right ventricular  size is normal. Tricuspid regurgitation signal is inadequate for assessing  PA pressure.  3. Left atrial size was moderately dilated.  4. The mitral valve is grossly normal. Trivial mitral valve  regurgitation.  5. The aortic valve is tricuspid. Aortic valve regurgitation is trivial.  Mild aortic valve sclerosis is present, with no evidence of aortic valve  stenosis.  6. The inferior vena cava is normal in size with <50% respiratory  variability, suggesting right atrial pressure of 8 mmHg.  Patient Profile     45 y.o. male who presented to the hospital with chest pain and shortness of breath, found to have non-STEMI  Assessment & Plan    1.  Non-STEMI: Patient currently on aspirin and heparin.   Troponin peaked at 1014.  Echo shows a normal ejection fraction.  Does have a history of cocaine abuse 2 days prior to his event which could be a trigger.  Ted Leonhart need left heart catheterization tomorrow.    2.  Hypertension: Currently on amlodipine and carvedilol.  Blood pressure better controlled today.   3.  Hyperlipidemia: Goal LDL less than 70.  Currently on Crestor.    For questions or updates, please contact [facility] Please consult [email or web address] for contact info under  Signed, Ronnell Clinger Jorja Loa, MD  03/31/2021, 9:26 AM

## 2021-03-31 NOTE — Progress Notes (Signed)
PROGRESS NOTE                                                                                                                                                                                                             Patient Demographics:    Anthony KosRobert Estrada, is a 45 y.o. male, DOB - 01/04/1976, ZOX:[id number]  Outpatient Primary MD for the patient is Marcine MatarJohnson, Deborah B, MD    LOS - 1  Admit date - 03/29/2021    Chief Complaint  Patient presents with  . Shortness of Breath       Brief Narrative (HPI from H&P)  - Patient in bed, appears comfortable, denies any headache, no fever, no chest pain or pressure, no shortness of breath , no abdominal pain. No new focal weakness.   Subjective:    Anthony Estrada today has, No headache, No chest pain, No abdominal pain - No Nausea, No new weakness tingling or numbness, no SOB.   Assessment  & Plan :     1. NSTEMI  - now pain free, likely due to cocaine abuse: Bladder well, cardiology managed by them.  He has been placed on heparin drip, Coreg & Crestor, pain free, LHC on 04/01/21.  2. Cocaine abuse.  Counseled to quit.  3. HTN - placed on Norvasc, Coreg & as needed IV Hydralazine.  4. AKI on CKD 3B -baseline creatinine around 1.7, AKI resolved, needed gentle hydration, back to baseline.   5. Left upper lobe lung nodule.  Outpatient pulmonary follow-up with       Condition -   Guarded  Family Communication  : wife from pt's phone  Code Status :  Full  Consults  :  Cards  PUD Prophylaxis : None   Procedures  :     CTA -  1. No pulmonary emboli or other acute chest pathology. 2. Redemonstration of a 1.8 x 1.6 cm well-circumscribed nodule in the left upper lobe, stable since 11/22/2019. This is favored to represent a benign nodule. Follow-up through December of 2022 suggested, as described previously, to achieve 24 month follow-up. 3. Aortic atherosclerosis.  TTE   1.  Left ventricular ejection fraction, by estimation, is 65 to 70%. The left ventricle has normal function. The left ventricle has no regional wall motion abnormalities. There is moderate left ventricular hypertrophy. Left ventricular diastolic  parameters are consistent with Grade I diastolic dysfunction (impaired relaxation). Elevated left ventricular end-diastolic pressure. The E/e' is 15.  2. Right ventricular systolic function is normal. The right ventricular size is normal. Tricuspid regurgitation signal is inadequate for assessing PA pressure.  3. Left atrial size was moderately dilated.  4. The mitral valve is grossly normal. Trivial mitral valve regurgitation.  5. The aortic valve is tricuspid. Aortic valve regurgitation is trivial.  Mild aortic valve sclerosis is present, with no evidence of aortic valve stenosis.  6. The inferior vena cava is normal in size with <50% respiratory variability, suggesting right atrial pressure of 8 mmHg.    L. Cath      Disposition Plan  :    Dispo: The patient is from: Home              Anticipated d/c is to: Home              Patient currently is not medically stable to d/c.   Difficult to place patient No  DVT Prophylaxis  :     Heparin   Lab Results  Component Value Date   PLT 195 03/31/2021    Diet :  Diet Order            Diet NPO time specified Except for: Sips with Meds  Diet effective midnight           Diet Heart Room service appropriate? Yes; Fluid consistency: Thin  Diet effective now                  Inpatient Medications  Scheduled Meds: . amLODipine  10 mg Oral Daily  . aspirin EC  81 mg Oral Daily  . carvedilol  6.25 mg Oral BID WC  . rosuvastatin  20 mg Oral Daily   Continuous Infusions: . [START ON 04/01/2021] sodium chloride     Followed by  . [START ON 04/01/2021] sodium chloride    . heparin 1,750 Units/hr (03/31/21 0500)   PRN Meds:.acetaminophen, hydrALAZINE, nitroGLYCERIN, ondansetron (ZOFRAN)  IV  Antibiotics  :    Anti-infectives (From admission, onward)   None       Time Spent in minutes  30   Susa Raring M.D on 03/31/2021 at 9:32 AM  To page go to [email or web address]   [facility] -  Office  [phone]    See all Orders from today for further details    Objective:   Vitals:   03/30/21 1330 03/30/21 1624 03/30/21 1758 03/30/21 2010  BP: 132/71 (!) 148/85 (!) 146/93 135/73  Pulse: 78 62 98 65  Resp: 20 20  18   Temp: 98 F (36.7 C) 98.6 F (37 C)  99 F (37.2 C)  TempSrc: Oral Oral  Oral  SpO2:    99%  Weight:      Height:        Wt Readings from Last 3 Encounters:  03/30/21 98 kg  02/12/21 101.6 kg  11/02/20 101.6 kg     Intake/Output Summary (Last 24 hours) at 03/31/2021 0932 Last data filed at 03/31/2021 0500 Gross per 24 hour  Intake 1399.98 ml  Output --  Net 1399.98 ml     Physical Exam  Awake Alert, No new F.N deficits, Normal affect [place].AT,PERRAL Supple Neck,No JVD, No cervical lymphadenopathy appriciated.  Symmetrical Chest wall movement, Good air movement bilaterally, CTAB RRR,No Gallops, Rubs or new Murmurs, No Parasternal Heave +ve B.Sounds, Abd Soft, No tenderness, No organomegaly appriciated, No rebound - guarding  or rigidity. No Cyanosis, Clubbing or edema, No new Rash or bruise    Data Review:    CBC Recent Labs  Lab 03/29/21 1046 03/30/21 0434 03/31/21 0124  WBC 6.3 4.2 4.8  HGB 13.2 13.6 13.2  HCT 39.3 40.3 39.4  PLT 190 183 195  MCV 91.2 91.4 93.6  MCH 30.6 30.8 31.4  MCHC 33.6 33.7 33.5  RDW 14.3 14.3 14.1  LYMPHSABS 2.2  --  2.3  MONOABS 0.8  --  0.5  EOSABS 0.2  --  0.2  BASOSABS 0.0  --  0.0    Recent Labs  Lab 03/29/21 1046 03/30/21 0434 03/31/21 0124  NA 136 138 139  K 3.0* 3.9 4.0  CL 105 105 103  CO2 GLUCOSE 101* 89 121*  BUN 26* 19 20  CREATININE 1.86* 1.75* 1.85*  CALCIUM 8.8* 8.7* 8.8*  AST 21  --  17  ALT 13  --  12  ALKPHOS 45  --  43  BILITOT 0.4  --  0.4   ALBUMIN 3.4*  --  3.3*  MG  --   --  2.2  DDIMER 1.10*  --   --   BNP 216.6*  --  151.7*    ------------------------------------------------------------------------------------------------------------------ Recent Labs    03/30/21 0434  CHOL 145  HDL 46  LDLCALC 87  TRIG 62  CHOLHDL 3.2    Lab Results  Component Value Date   HGBA1C 5.3 09/13/2020   ------------------------------------------------------------------------------------------------------------------ No results for input(s): TSH, T4TOTAL, T3FREE, THYROIDAB in the last 72 hours.  Invalid input(s): FREET3  Cardiac Enzymes No results for input(s): CKMB, TROPONINI, MYOGLOBIN in the last 168 hours.  Invalid input(s): CK ------------------------------------------------------------------------------------------------------------------    Component Value Date/Time   BNP 151.7 (H) 03/31/2021 0124    Micro Results Recent Results (from the past 240 hour(s))  Resp Panel by RT-PCR (Flu A&B, Covid) Nasopharyngeal Swab     Status: None   Collection Time: 03/29/21 10:46 AM   Specimen: Nasopharyngeal Swab; Nasopharyngeal(NP) swabs in vial transport medium  Result Value Ref Range Status   SARS Coronavirus 2 by RT PCR NEGATIVE NEGATIVE Final    Comment: (NOTE) SARS-CoV-2 target nucleic acids are NOT DETECTED.  The SARS-CoV-2 RNA is generally detectable in upper respiratory specimens during the acute phase of infection. The lowest concentration of SARS-CoV-2 viral copies this assay can detect is 138 copies/mL. A negative result does not preclude SARS-Cov-2 infection and should not be used as the sole basis for treatment or other patient management decisions. A negative result may occur with  improper specimen collection/handling, submission of specimen other than nasopharyngeal swab, presence of viral mutation(s) within the areas targeted by this assay, and inadequate number of viral copies(<138 copies/mL). A negative  result must be combined with clinical observations, patient history, and epidemiological information. The expected result is Negative.  Fact Sheet for Patients:  BloggerCourse.com  Fact Sheet for Healthcare Providers:  SeriousBroker.it  This test is no t yet approved or cleared by the [place] FDA and  has been authorized for detection and/or diagnosis of SARS-CoV-2 by FDA under an Emergency Use Authorization (EUA). This EUA will remain  in effect (meaning this test can be used) for the duration of the COVID-19 declaration under Section 564(b)(1) of the Act, 21 U.S.C.section 360bbb-3(b)(1), unless the authorization is terminated  or revoked sooner.       Influenza A by PCR NEGATIVE NEGATIVE Final   Influenza B by PCR NEGATIVE NEGATIVE Final  Comment: (NOTE) The Xpert Xpress SARS-CoV-2/FLU/RSV plus assay is intended as an aid in the diagnosis of influenza from Nasopharyngeal swab specimens and should not be used as a sole basis for treatment. Nasal washings and aspirates are unacceptable for Xpert Xpress SARS-CoV-2/FLU/RSV testing.  Fact Sheet for Patients: BloggerCourse.com  Fact Sheet for Healthcare Providers: SeriousBroker.it  This test is not yet approved or cleared by the [place] FDA and has been authorized for detection and/or diagnosis of SARS-CoV-2 by FDA under an Emergency Use Authorization (EUA). This EUA will remain in effect (meaning this test can be used) for the duration of the COVID-19 declaration under Section 564(b)(1) of the Act, 21 U.S.C. section 360bbb-3(b)(1), unless the authorization is terminated or revoked.  Performed at [facility], [address] [place], [place] [address]     Radiology Reports CT Angio Chest PE W and/or Wo Contrast  Result Date: 03/29/2021 CLINICAL DATA:  Shortness of breath EXAM: CT ANGIOGRAPHY CHEST  WITH CONTRAST TECHNIQUE: Multidetector CT imaging of the chest was performed using the standard protocol during bolus administration of intravenous contrast. Multiplanar CT image reconstructions and MIPs were obtained to evaluate the vascular anatomy. CONTRAST:  OMNIPAQUE IOHEXOL 350 MG/ML SOLN COMPARISON:  Chest radiography same day.  CT 10/29/2020. FINDINGS: Cardiovascular: Heart size is normal. No coronary artery calcification. Very minimal thoracic aortic atherosclerotic calcification. Pulmonary arterial opacification is good. There are no pulmonary emboli. Mediastinum/Nodes: No mass or lymphadenopathy. Lungs/Pleura: Redemonstration of a 1.8 x 1.6 cm well-circumscribed nodule in the left upper lobe, stable since 11/22/2019. This is favored to represent a benign nodule. However, follow-up through December of 2022 is recommended as described previously to achieve 24 month follow-up. No pleural effusion. No second pulmonary parenchymal finding. Upper Abdomen: Normal Musculoskeletal: No significant skeletal finding. Review of the MIP images confirms the above findings. IMPRESSION: 1. No pulmonary emboli or other acute chest pathology. 2. Redemonstration of a 1.8 x 1.6 cm well-circumscribed nodule in the left upper lobe, stable since 11/22/2019. This is favored to represent a benign nodule. Follow-up through December of 2022 suggested, as described previously, to achieve 24 month follow-up. 3. Aortic atherosclerosis. Aortic Atherosclerosis (ICD10-I70.0). Electronically Signed   By: Paulina Fusi M.D.   On: 03/29/2021 13:20   DG Chest Portable 1 View  Result Date: 03/29/2021 CLINICAL DATA:  Shortness of breath.  Chest pressure. EXAM: PORTABLE CHEST 1 VIEW COMPARISON:  August 28, 2020. FINDINGS: Mild enlargement the cardiac silhouette, likely accentuated by AP portable technique. No consolidation. Similar size of an approximately 1.6 cm left upper lobe for a nodule, better characterized on CT chest from  November 29, 21. No visible pleural effusions or pneumothorax. No acute osseous abnormality. IMPRESSION: 1. No evidence of acute cardiopulmonary disease. 2. Similar size of an approximately 1.6 cm left upper lobe for a nodule, better characterized on CT chest from November 29, 21. Electronically Signed   By: Feliberto Harts MD   On: 03/29/2021 11:15   ECHOCARDIOGRAM COMPLETE  Result Date: 03/30/2021    ECHOCARDIOGRAM REPORT   Patient Name:   Anthony Estrada Date of Exam: 03/30/2021 Medical Rec #:  [id number]        Height:       64.0 in Accession #:    [id number]       Weight:       216.1 lb Date of Birth:  07-17-1976         BSA:  2.022 m Patient Age:    45 years         BP:           156/99 mmHg Patient Gender: M                HR:           78 bpm. Exam Location:  Inpatient Procedure: 2D Echo, Cardiac Doppler and Color Doppler Indications:    Chest pain  History:        Patient has no prior history of Echocardiogram examinations.                 Signs/Symptoms:Shortness of Breath; Risk Factors:Hypertension                 and Dyslipidemia. Cocaine abuse.  Sonographer:    Ross Ludwig RDCS (AE) Referring Phys: [id number] MICHAEL E NORINS IMPRESSIONS  1. Left ventricular ejection fraction, by estimation, is 65 to 70%. The left ventricle has normal function. The left ventricle has no regional wall motion abnormalities. There is moderate left ventricular hypertrophy. Left ventricular diastolic parameters are consistent with Grade I diastolic dysfunction (impaired relaxation). Elevated left ventricular end-diastolic pressure. The E/e' is 15.  2. Right ventricular systolic function is normal. The right ventricular size is normal. Tricuspid regurgitation signal is inadequate for assessing PA pressure.  3. Left atrial size was moderately dilated.  4. The mitral valve is grossly normal. Trivial mitral valve regurgitation.  5. The aortic valve is tricuspid. Aortic valve regurgitation is trivial. Mild aortic valve  sclerosis is present, with no evidence of aortic valve stenosis.  6. The inferior vena cava is normal in size with <50% respiratory variability, suggesting right atrial pressure of 8 mmHg. Comparison(s): No prior Echocardiogram. FINDINGS  Left Ventricle: Left ventricular ejection fraction, by estimation, is 65 to 70%. The left ventricle has normal function. The left ventricle has no regional wall motion abnormalities. The left ventricular internal cavity size was normal in size. There is  moderate left ventricular hypertrophy. Left ventricular diastolic parameters are consistent with Grade I diastolic dysfunction (impaired relaxation). Elevated left ventricular end-diastolic pressure. The E/e' is 15. Right Ventricle: The right ventricular size is normal. No increase in right ventricular wall thickness. Right ventricular systolic function is normal. Tricuspid regurgitation signal is inadequate for assessing PA pressure. Left Atrium: Left atrial size was moderately dilated. Right Atrium: Right atrial size was normal in size. Pericardium: There is no evidence of pericardial effusion. Mitral Valve: The mitral valve is grossly normal. Trivial mitral valve regurgitation. MV peak gradient, 4.5 mmHg. The mean mitral valve gradient is 2.0 mmHg. Tricuspid Valve: The tricuspid valve is grossly normal. Tricuspid valve regurgitation is trivial. Aortic Valve: The aortic valve is tricuspid. Aortic valve regurgitation is trivial. Mild aortic valve sclerosis is present, with no evidence of aortic valve stenosis. Aortic valve mean gradient measures 8.0 mmHg. Aortic valve peak gradient measures 15.1 mmHg. Aortic valve area, by VTI measures 2.50 cm. Pulmonic Valve: The pulmonic valve was grossly normal. Pulmonic valve regurgitation is not visualized. Aorta: The aortic root and ascending aorta are structurally normal, with no evidence of dilitation. Venous: The inferior vena cava is normal in size with less than 50% respiratory  variability, suggesting right atrial pressure of 8 mmHg. IAS/Shunts: No atrial level shunt detected by color flow Doppler.  LEFT VENTRICLE PLAX 2D LVIDd:         5.10 cm  Diastology LVIDs:         2.90  cm  LV e' medial:    5.77 cm/s LV PW:         1.70 cm  LV E/e' medial:  15.0 LV IVS:        1.50 cm  LV e' lateral:   5.77 cm/s LVOT diam:     2.00 cm  LV E/e' lateral: 15.0 LV SV:         85 LV SV Index:   42 LVOT Area:     3.14 cm  RIGHT VENTRICLE             IVC RV S prime:     16.30 cm/s  IVC diam: 1.60 cm TAPSE (M-mode): 3.1 cm LEFT ATRIUM             Index       RIGHT ATRIUM           Index LA diam:        4.30 cm 2.13 cm/m  RA Area:     20.60 cm LA Vol (A2C):   76.9 ml 38.04 ml/m RA Volume:   57.10 ml  28.24 ml/m LA Vol (A4C):   80.1 ml 39.62 ml/m LA Biplane Vol: 82.4 ml 40.76 ml/m  AORTIC VALVE AV Area (Vmax):    2.46 cm AV Area (Vmean):   2.27 cm AV Area (VTI):     2.50 cm AV Vmax:           194.00 cm/s AV Vmean:          132.000 cm/s AV VTI:            0.339 m AV Peak Grad:      15.1 mmHg AV Mean Grad:      8.0 mmHg LVOT Vmax:         152.00 cm/s LVOT Vmean:        95.200 cm/s LVOT VTI:          0.270 m LVOT/AV VTI ratio: 0.80  AORTA Ao Root diam: 3.50 cm Ao Asc diam:  3.20 cm MITRAL VALVE MV Area (PHT): 3.12 cm    SHUNTS MV Area VTI:   2.37 cm    Systemic VTI:  0.27 m MV Peak grad:  4.5 mmHg    Systemic Diam: 2.00 cm MV Mean grad:  2.0 mmHg MV Vmax:       1.06 m/s MV Vmean:      68.7 cm/s MV Decel Time: 243 msec MV E velocity: 86.50 cm/s MV A velocity: 68.10 cm/s MV E/A ratio:  1.27 Zoila Shutter MD Electronically signed by Zoila Shutter MD Signature Date/Time: 03/30/2021/12:23:28 PM    Final

## 2021-04-01 ENCOUNTER — Encounter (HOSPITAL_COMMUNITY)
Admission: EM | Disposition: A | Discharge status: Home / Self Care | Payer: Self-pay | Source: Home / Self Care | Attending: Internal Medicine

## 2021-04-01 DIAGNOSIS — R778 Other specified abnormalities of plasma proteins: Secondary | ICD-10-CM

## 2021-04-01 DIAGNOSIS — R7989 Other specified abnormal findings of blood chemistry: Secondary | ICD-10-CM

## 2021-04-01 DIAGNOSIS — R0782 Intercostal pain: Secondary | ICD-10-CM

## 2021-04-01 DIAGNOSIS — I422 Other hypertrophic cardiomyopathy: Secondary | ICD-10-CM

## 2021-04-01 DIAGNOSIS — N183 Chronic kidney disease, stage 3 unspecified: Secondary | ICD-10-CM

## 2021-04-01 HISTORY — PX: LEFT HEART CATH AND CORONARY ANGIOGRAPHY: CATH118249

## 2021-04-01 LAB — CBC WITH DIFFERENTIAL/PLATELET
Abs Immature Granulocytes: 0.01 10*3/uL (ref 0.00–0.07)
Basophils Absolute: 0 10*3/uL (ref 0.0–0.1)
Basophils Relative: 0 %
Eosinophils Absolute: 0.2 10*3/uL (ref 0.0–0.5)
Eosinophils Relative: 3 %
HCT: 38 % — ABNORMAL LOW (ref 39.0–52.0)
Hemoglobin: 12.7 g/dL — ABNORMAL LOW (ref 13.0–17.0)
Immature Granulocytes: 0 %
Lymphocytes Relative: 49 %
Lymphs Abs: 2.4 10*3/uL (ref 0.7–4.0)
MCH: 31.2 pg (ref 26.0–34.0)
MCHC: 33.4 g/dL (ref 30.0–36.0)
MCV: 93.4 fL (ref 80.0–100.0)
Monocytes Absolute: 0.5 10*3/uL (ref 0.1–1.0)
Monocytes Relative: 11 %
Neutro Abs: 1.8 10*3/uL (ref 1.7–7.7)
Neutrophils Relative %: 37 %
Platelets: 168 10*3/uL (ref 150–400)
RBC: 4.07 MIL/uL — ABNORMAL LOW (ref 4.22–5.81)
RDW: 13.9 % (ref 11.5–15.5)
WBC: 4.9 10*3/uL (ref 4.0–10.5)
nRBC: 0 % (ref 0.0–0.2)

## 2021-04-01 LAB — BRAIN NATRIURETIC PEPTIDE: B Natriuretic Peptide: 267 pg/mL — ABNORMAL HIGH (ref 0.0–100.0)

## 2021-04-01 LAB — COMPREHENSIVE METABOLIC PANEL
ALT: 13 U/L (ref 0–44)
AST: 17 U/L (ref 15–41)
Albumin: 3.2 g/dL — ABNORMAL LOW (ref 3.5–5.0)
Alkaline Phosphatase: 40 U/L (ref 38–126)
Anion gap: 7 (ref 5–15)
BUN: 15 mg/dL (ref 6–20)
CO2: 27 mmol/L (ref 22–32)
Calcium: 8.8 mg/dL — ABNORMAL LOW (ref 8.9–10.3)
Chloride: 106 mmol/L (ref 98–111)
Creatinine, Ser: 1.72 mg/dL — ABNORMAL HIGH (ref 0.61–1.24)
GFR, Estimated: 49 mL/min — ABNORMAL LOW (ref >60)
Glucose, Bld: 111 mg/dL — ABNORMAL HIGH (ref 70–99)
Potassium: 4 mmol/L (ref 3.5–5.1)
Sodium: 140 mmol/L (ref 135–145)
Total Bilirubin: 0.6 mg/dL (ref 0.3–1.2)
Total Protein: 5.8 g/dL — ABNORMAL LOW (ref 6.5–8.1)

## 2021-04-01 LAB — TYPE AND SCREEN
ABO/RH(D): O POS
Antibody Screen: NEGATIVE

## 2021-04-01 LAB — MAGNESIUM: Magnesium: 2.1 mg/dL (ref 1.7–2.4)

## 2021-04-01 LAB — ABO/RH: ABO/RH(D): O POS

## 2021-04-01 LAB — HEPARIN LEVEL (UNFRACTIONATED): Heparin Unfractionated: 0.58 IU/mL (ref 0.30–0.70)

## 2021-04-01 SURGERY — LEFT HEART CATH AND CORONARY ANGIOGRAPHY
Anesthesia: LOCAL

## 2021-04-01 MED ORDER — HYDRALAZINE HCL 20 MG/ML IJ SOLN: 10.0000 mg | INTRAMUSCULAR | Status: DC | PRN

## 2021-04-01 MED ORDER — ISOSORBIDE MONONITRATE ER 60 MG PO TB24
60.0000 mg | ORAL_TABLET | Freq: Every day | ORAL | Status: DC
  Administered 2021-04-01: 60 mg via ORAL
  Filled 2021-04-01: qty 1

## 2021-04-01 MED ORDER — ROSUVASTATIN CALCIUM 20 MG PO TABS
20.0000 mg | ORAL_TABLET | Freq: Every day | ORAL | 0 refills | Status: DC
  Filled 2021-04-02: qty 30, 30d supply, fill #0

## 2021-04-01 MED ORDER — ASPIRIN 81 MG PO TBEC: 81.0000 mg | DELAYED_RELEASE_TABLET | Freq: Every day | ORAL | 0 refills | Status: DC

## 2021-04-01 MED ORDER — CARVEDILOL 6.25 MG PO TABS: 6.2500 mg | ORAL_TABLET | Freq: Two times a day (BID) | ORAL | Status: DC

## 2021-04-01 MED ORDER — SODIUM CHLORIDE 0.9 % IV SOLN: INTRAVENOUS | Status: AC

## 2021-04-01 MED ORDER — LABETALOL HCL 5 MG/ML IV SOLN: 10.0000 mg | INTRAVENOUS | Status: DC | PRN

## 2021-04-01 MED ORDER — SODIUM CHLORIDE 0.9% FLUSH: 3.0000 mL | INTRAVENOUS | Status: DC | PRN

## 2021-04-01 MED ORDER — FENTANYL CITRATE (PF) 100 MCG/2ML IJ SOLN
INTRAMUSCULAR | Status: DC | PRN
  Administered 2021-04-01: 50 ug via INTRAVENOUS

## 2021-04-01 MED ORDER — SODIUM CHLORIDE 0.9 % IV SOLN: 250.0000 mL | INTRAVENOUS | Status: DC | PRN

## 2021-04-01 MED ORDER — VERAPAMIL HCL 2.5 MG/ML IV SOLN
INTRAVENOUS | Status: AC
  Filled 2021-04-01: qty 2

## 2021-04-01 MED ORDER — HEPARIN (PORCINE) IN NACL 1000-0.9 UT/500ML-% IV SOLN
INTRAVENOUS | Status: DC | PRN
  Administered 2021-04-01 (×2): 500 mL

## 2021-04-01 MED ORDER — SODIUM CHLORIDE 0.9% FLUSH: 3.0000 mL | Freq: Two times a day (BID) | INTRAVENOUS | Status: DC

## 2021-04-01 MED ORDER — MIDAZOLAM HCL 2 MG/2ML IJ SOLN
INTRAMUSCULAR | Status: DC | PRN
  Administered 2021-04-01: 2 mg via INTRAVENOUS

## 2021-04-01 MED ORDER — HEPARIN SODIUM (PORCINE) 1000 UNIT/ML IJ SOLN
INTRAMUSCULAR | Status: AC
  Filled 2021-04-01: qty 1

## 2021-04-01 MED ORDER — CARVEDILOL 3.125 MG PO TABS
3.1250 mg | ORAL_TABLET | Freq: Two times a day (BID) | ORAL | Status: DC
  Administered 2021-04-01: 3.125 mg via ORAL
  Filled 2021-04-01: qty 1

## 2021-04-01 MED ORDER — MIDAZOLAM HCL 2 MG/2ML IJ SOLN
INTRAMUSCULAR | Status: AC
  Filled 2021-04-01: qty 2

## 2021-04-01 MED ORDER — HYDRALAZINE HCL 25 MG PO TABS
12.5000 mg | ORAL_TABLET | Freq: Two times a day (BID) | ORAL | Status: DC
  Filled 2021-04-01: qty 1

## 2021-04-01 MED ORDER — AMLODIPINE BESYLATE 10 MG PO TABS
10.0000 mg | ORAL_TABLET | Freq: Every day | ORAL | 0 refills | Status: DC
  Filled 2021-04-02: qty 30, 30d supply, fill #0

## 2021-04-01 MED ORDER — ONDANSETRON HCL 4 MG/2ML IJ SOLN: 4.0000 mg | Freq: Four times a day (QID) | INTRAMUSCULAR | Status: DC | PRN

## 2021-04-01 MED ORDER — FENTANYL CITRATE (PF) 100 MCG/2ML IJ SOLN
INTRAMUSCULAR | Status: AC
  Filled 2021-04-01: qty 2

## 2021-04-01 MED ORDER — CARVEDILOL 3.125 MG PO TABS
3.1250 mg | ORAL_TABLET | Freq: Two times a day (BID) | ORAL | 0 refills | Status: DC
  Filled 2021-04-02: qty 60, 30d supply, fill #0

## 2021-04-01 MED ORDER — LIDOCAINE HCL (PF) 1 % IJ SOLN
INTRAMUSCULAR | Status: DC | PRN
  Administered 2021-04-01: 2 mL

## 2021-04-01 MED ORDER — IOHEXOL 350 MG/ML SOLN
INTRAVENOUS | Status: DC | PRN
  Administered 2021-04-01: 60 mL

## 2021-04-01 MED ORDER — ACETAMINOPHEN 325 MG PO TABS: 650.0000 mg | ORAL_TABLET | ORAL | Status: DC | PRN

## 2021-04-01 MED ORDER — HEPARIN (PORCINE) IN NACL 1000-0.9 UT/500ML-% IV SOLN
INTRAVENOUS | Status: AC
  Filled 2021-04-01: qty 1000

## 2021-04-01 MED ORDER — ISOSORBIDE MONONITRATE ER 60 MG PO TB24
60.0000 mg | ORAL_TABLET | Freq: Every day | ORAL | 0 refills | Status: DC
  Filled 2021-04-02: qty 30, 30d supply, fill #0

## 2021-04-01 MED ORDER — HEPARIN SODIUM (PORCINE) 1000 UNIT/ML IJ SOLN
INTRAMUSCULAR | Status: DC | PRN
  Administered 2021-04-01: 5000 [IU] via INTRAVENOUS

## 2021-04-01 MED ORDER — LIDOCAINE HCL (PF) 1 % IJ SOLN
INTRAMUSCULAR | Status: AC
  Filled 2021-04-01: qty 30

## 2021-04-01 MED ORDER — NITROGLYCERIN 0.4 MG SL SUBL
0.4000 mg | SUBLINGUAL_TABLET | SUBLINGUAL | 0 refills | Status: AC | PRN
Start: 2021-04-01 — End: ?
  Filled 2021-04-02: qty 25, 15d supply, fill #0

## 2021-04-01 MED ORDER — VERAPAMIL HCL 2.5 MG/ML IV SOLN
INTRAVENOUS | Status: DC | PRN
  Administered 2021-04-01: 10 mL via INTRA_ARTERIAL

## 2021-04-01 SURGICAL SUPPLY — 10 items
CATH 5FR JL3.5 JR4 ANG PIG MP (CATHETERS) ×1 IMPLANT
CATH INFINITI 5FR AL1 (CATHETERS) ×1 IMPLANT
DEVICE RAD COMP TR BAND LRG (VASCULAR PRODUCTS) ×1 IMPLANT
GLIDESHEATH SLEND SS 6F .021 (SHEATH) ×1 IMPLANT
GUIDEWIRE INQWIRE 1.5J.035X260 (WIRE) IMPLANT
INQWIRE 1.5J .035X260CM (WIRE) ×2
KIT HEART LEFT (KITS) ×2 IMPLANT
PACK CARDIAC CATHETERIZATION (CUSTOM PROCEDURE TRAY) ×2 IMPLANT
TRANSDUCER W/STOPCOCK (MISCELLANEOUS) ×2 IMPLANT
TUBING CIL FLEX 10 FLL-RA (TUBING) ×2 IMPLANT

## 2021-04-01 NOTE — Interval H&P Note (Signed)
Cath Lab Visit (complete for each Cath Lab visit)  Clinical Evaluation Leading to the Procedure:   ACS: Yes.    Non-ACS:    Anginal Classification: CCS IV  Anti-ischemic medical therapy: Minimal Therapy (1 class of medications)  Non-Invasive Test Results: No non-invasive testing performed  Prior CABG: No previous CABG      History and Physical Interval Note:  04/01/2021 1:44 PM  Anthony Estrada  has presented today for surgery, with the diagnosis of NSTEMI.  The various methods of treatment have been discussed with the patient and family. After consideration of risks, benefits and other options for treatment, the patient has consented to  Procedure(s): LEFT HEART CATH AND CORONARY ANGIOGRAPHY (N/A) as a surgical intervention.  The patient's history has been reviewed, patient examined, no change in status, stable for surgery.  I have reviewed the patient's chart and labs.  Questions were answered to the patient's satisfaction.     Lance Muss

## 2021-04-01 NOTE — Progress Notes (Addendum)
The patient has been seen in conjunction with Karl Bales, NP. All aspects of care have been considered and discussed. The patient has been personally interviewed, examined, and all clinical data has been reviewed.   Hypertrophic cardiomyopathy, likely physiologic related heavy workout schedule/hypertension versus pathologic.  There is a possibility that he has a wild-type mutation.  If hypertrophy is physiologic, ARB and diuretic therapy may be helpful.  Heart rate being relatively low limits the use of beta-blocker therapy.  Elevated troponin I in the setting of cocaine use and hypertrophic cardiomyopathy likely resulting in type II myocardial injury.  It is appropriate to rule out obstructive coronary disease given the patient's risk factors which include hypertension/tobacco use/CKD.  If coronaries are clean, had some future time, cardiac MRI might be helpful in determining if he has physiologic versus pathologic hypertrophy.  If he does not get the stent, is able ambulate well, and we have reasonable blood pressure control, may be able to discharge later today.  Refused to discuss substance use today while prep parents were in the room.   Progress Note  Patient Name: Anthony Estrada Date of Encounter: 04/01/2021  [facility] Cardiologist: New to Dr Tamala Julian   Subjective   Patient states he never had chest pain. He is no longer feeling SOB.He has questions regarding his cardiac catheterization and wishes to know what are the risks and if stents are being placed. He states he is feeling well, getting ready to take a shower. Staff RN reports patient's Coreg was increased and his heart rate has been dropping to 40s without symptoms.    Inpatient Medications    Scheduled Meds: . amLODipine  10 mg Oral Daily  . aspirin EC  81 mg Oral Daily  . carvedilol  3.125 mg Oral BID WC  . isosorbide mononitrate  60 mg Oral Daily  . rosuvastatin  20 mg Oral Daily   Continuous Infusions: .  sodium chloride 1 mL/kg/hr (04/01/21 0634)  . heparin 1,750 Units/hr (03/31/21 2111)   PRN Meds: acetaminophen, hydrALAZINE, nitroGLYCERIN, ondansetron (ZOFRAN) IV   Vital Signs    Vitals:   03/31/21 1606 03/31/21 2030 04/01/21 0439 04/01/21 0926  BP: 131/85 (!) 141/90 137/80 (!) 144/102  Pulse: (!) 107 72 (!) 49   Resp: $Remo'18 18 18   'hwOMP$ Temp: 98.2 F (36.8 C) 98.1 F (36.7 C) 98.3 F (36.8 C)   TempSrc: Oral Oral Oral   SpO2: 99% 100%    Weight:      Height:        Intake/Output Summary (Last 24 hours) at 04/01/2021 1121 Last data filed at 03/31/2021 2000 Gross per 24 hour  Intake 150 ml  Output --  Net 150 ml   Last 3 Weights 03/30/2021 03/29/2021 03/29/2021  Weight (lbs) 216 lb 1.6 oz 216 lb 8 oz 223 lb 5.2 oz  Weight (kg) 98.022 kg 98.204 kg 101.3 kg      Telemetry    Sinus rhythm with HR 80s, artifacts, bradycardia to 50s noted - Personally Reviewed  ECG    No new tracing - Personally Reviewed  Physical Exam   GEN: No acute distress. Sitting in bed.   Neck: No JVD Cardiac: RRR, no murmurs, rubs, or gallops.  Respiratory: Clear to auscultation bilaterally. On room air.  GI: Soft, nontender, non-distended  MS: No BLE edema; No deformity. Neuro:  Alert and oriented x3, follow commands appopriately Psych: Normal affect   Labs    High Sensitivity Troponin:   Recent  Labs  Lab 03/29/21 1046 03/29/21 1235 03/29/21 1557 03/29/21 1845  TROPONINIHS 80* 273* 904* 1,014*      Chemistry Recent Labs  Lab 03/29/21 1046 03/30/21 0434 03/31/21 0124 04/01/21 0306  NA 136 138 139 140  K 3.0* 3.9 4.0 4.0  CL 105 105 103 106  CO2 $Re'24 26 29 27  'EDr$ GLUCOSE 101* 89 121* 111*  BUN 26* $Remov'19 20 15  'tQSgPB$ CREATININE 1.86* 1.75* 1.85* 1.72*  CALCIUM 8.8* 8.7* 8.8* 8.8*  PROT 6.6  --  5.8* 5.8*  ALBUMIN 3.4*  --  3.3* 3.2*  AST 21  --  17 17  ALT 13  --  12 13  ALKPHOS 45  --  43 40  BILITOT 0.4  --  0.4 0.6  GFRNONAA 45* 48* 45* 49*  ANIONGAP $RemoveB'7 7 7 7     'NfvNSAzX$ Hematology Recent  Labs  Lab 03/30/21 0434 03/31/21 0124 04/01/21 0306  WBC 4.2 4.8 4.9  RBC 4.41 4.21* 4.07*  HGB 13.6 13.2 12.7*  HCT 40.3 39.4 38.0*  MCV 91.4 93.6 93.4  MCH 30.8 31.4 31.2  MCHC 33.7 33.5 33.4  RDW 14.3 14.1 13.9  PLT 183 195 168    BNP Recent Labs  Lab 03/29/21 1046 03/31/21 0124 04/01/21 0306  BNP 216.6* 151.7* 267.0*     DDimer  Recent Labs  Lab 03/29/21 1046  DDIMER 1.10*     Radiology    ECHOCARDIOGRAM COMPLETE  Result Date: 03/30/2021    ECHOCARDIOGRAM REPORT   Patient Name:   Anthony Estrada Date of Exam: 03/30/2021 Medical Rec #:  [id number]        Height:       64.0 in Accession #:    [id number]       Weight:       216.1 lb Date of Birth:  1976/06/12         BSA:          2.022 m Patient Age:    45 years         BP:           156/99 mmHg Patient Gender: M                HR:           78 bpm. Exam Location:  Inpatient Procedure: 2D Echo, Cardiac Doppler and Color Doppler Indications:    Chest pain  History:        Patient has no prior history of Echocardiogram examinations.                 Signs/Symptoms:Shortness of Breath; Risk Factors:Hypertension                 and Dyslipidemia. Cocaine abuse.  Sonographer:    Clayton Lefort RDCS (AE) Referring Phys: [place]  1. Left ventricular ejection fraction, by estimation, is 65 to 70%. The left ventricle has normal function. The left ventricle has no regional wall motion abnormalities. There is moderate left ventricular hypertrophy. Left ventricular diastolic parameters are consistent with Grade I diastolic dysfunction (impaired relaxation). Elevated left ventricular end-diastolic pressure. The E/e' is 84.  2. Right ventricular systolic function is normal. The right ventricular size is normal. Tricuspid regurgitation signal is inadequate for assessing PA pressure.  3. Left atrial size was moderately dilated.  4. The mitral valve is grossly normal. Trivial mitral valve regurgitation.  5. The aortic valve is  tricuspid. Aortic valve regurgitation is trivial. Mild  aortic valve sclerosis is present, with no evidence of aortic valve stenosis.  6. The inferior vena cava is normal in size with <50% respiratory variability, suggesting right atrial pressure of 8 mmHg. Comparison(s): No prior Echocardiogram. FINDINGS  Left Ventricle: Left ventricular ejection fraction, by estimation, is 65 to 70%. The left ventricle has normal function. The left ventricle has no regional wall motion abnormalities. The left ventricular internal cavity size was normal in size. There is  moderate left ventricular hypertrophy. Left ventricular diastolic parameters are consistent with Grade I diastolic dysfunction (impaired relaxation). Elevated left ventricular end-diastolic pressure. The E/e' is 15. Right Ventricle: The right ventricular size is normal. No increase in right ventricular wall thickness. Right ventricular systolic function is normal. Tricuspid regurgitation signal is inadequate for assessing PA pressure. Left Atrium: Left atrial size was moderately dilated. Right Atrium: Right atrial size was normal in size. Pericardium: There is no evidence of pericardial effusion. Mitral Valve: The mitral valve is grossly normal. Trivial mitral valve regurgitation. MV peak gradient, 4.5 mmHg. The mean mitral valve gradient is 2.0 mmHg. Tricuspid Valve: The tricuspid valve is grossly normal. Tricuspid valve regurgitation is trivial. Aortic Valve: The aortic valve is tricuspid. Aortic valve regurgitation is trivial. Mild aortic valve sclerosis is present, with no evidence of aortic valve stenosis. Aortic valve mean gradient measures 8.0 mmHg. Aortic valve peak gradient measures 15.1 mmHg. Aortic valve area, by VTI measures 2.50 cm. Pulmonic Valve: The pulmonic valve was grossly normal. Pulmonic valve regurgitation is not visualized. Aorta: The aortic root and ascending aorta are structurally normal, with no evidence of dilitation. Venous: The  inferior vena cava is normal in size with less than 50% respiratory variability, suggesting right atrial pressure of 8 mmHg. IAS/Shunts: No atrial level shunt detected by color flow Doppler.  LEFT VENTRICLE PLAX 2D LVIDd:         5.10 cm  Diastology LVIDs:         2.90 cm  LV e' medial:    5.77 cm/s LV PW:         1.70 cm  LV E/e' medial:  15.0 LV IVS:        1.50 cm  LV e' lateral:   5.77 cm/s LVOT diam:     2.00 cm  LV E/e' lateral: 15.0 LV SV:         85 LV SV Index:   42 LVOT Area:     3.14 cm  RIGHT VENTRICLE             IVC RV S prime:     16.30 cm/s  IVC diam: 1.60 cm TAPSE (M-mode): 3.1 cm LEFT ATRIUM             Index       RIGHT ATRIUM           Index LA diam:        4.30 cm 2.13 cm/m  RA Area:     20.60 cm LA Vol (A2C):   76.9 ml 38.04 ml/m RA Volume:   57.10 ml  28.24 ml/m LA Vol (A4C):   80.1 ml 39.62 ml/m LA Biplane Vol: 82.4 ml 40.76 ml/m  AORTIC VALVE AV Area (Vmax):    2.46 cm AV Area (Vmean):   2.27 cm AV Area (VTI):     2.50 cm AV Vmax:           194.00 cm/s AV Vmean:          132.000 cm/s AV VTI:  0.339 m AV Peak Grad:      15.1 mmHg AV Mean Grad:      8.0 mmHg LVOT Vmax:         152.00 cm/s LVOT Vmean:        95.200 cm/s LVOT VTI:          0.270 m LVOT/AV VTI ratio: 0.80  AORTA Ao Root diam: 3.50 cm Ao Asc diam:  3.20 cm MITRAL VALVE MV Area (PHT): 3.12 cm    SHUNTS MV Area VTI:   2.37 cm    Systemic VTI:  0.27 m MV Peak grad:  4.5 mmHg    Systemic Diam: 2.00 cm MV Mean grad:  2.0 mmHg MV Vmax:       1.06 m/s MV Vmean:      68.7 cm/s MV Decel Time: 243 msec MV E velocity: 86.50 cm/s MV A velocity: 68.10 cm/s MV E/A ratio:  1.27 Lyman Bishop MD Electronically signed by Lyman Bishop MD Signature Date/Time: 03/30/2021/12:23:28 PM    Final     Cardiac Studies   Echo from 03/30/21:  1. Left ventricular ejection fraction, by estimation, is 65 to 70%. The  left ventricle has normal function. The left ventricle has no regional  wall motion abnormalities. There is moderate  left ventricular hypertrophy.  Left ventricular diastolic parameters are consistent with Grade I diastolic dysfunction (impaired relaxation). Elevated left ventricular end-diastolic pressure. The E/e' is  73.  2. Right ventricular systolic function is normal. The right ventricular  size is normal. Tricuspid regurgitation signal is inadequate for assessing  PA pressure.  3. Left atrial size was moderately dilated.  4. The mitral valve is grossly normal. Trivial mitral valve  regurgitation.  5. The aortic valve is tricuspid. Aortic valve regurgitation is trivial.  Mild aortic valve sclerosis is present, with no evidence of aortic valve  stenosis.  6. The inferior vena cava is normal in size with <50% respiratory  variability, suggesting right atrial pressure of 8 mmHg.    Patient Profile     45 y.o. male with PMH of HTN, tobacco abuse, cocaine abuse, who presented here for SOB, found to have NSTEMI, transferred from [facility] to [facility] for further management. Cardiology is consulted and following  for NSTEMI.   Assessment & Plan    NSTEMI - presented to [facility] ER for SOB, transferred to [facility] - Hs trop 80 >273 >904 >1014 - BNP 216 >151 >267 - EKG TWI noted of inferolateral leads, non-specific change comparing to 08/2020 EKG - pending LHC today , risk versus benefit discussed with the patient at bedside, patient consented for the procedure and voiced understanding  - continue ASA, heparin gtt, and statin for medical therapy now   Cocaine abuse - recommend cessation   Hypertension - BP elevated 131/85 - 152/97 over past 24 hours - continue Amlodipine 10mg  daily - patient refuse discontinue Coreg, discussed his bradycardia 40-50s, he states it works well for his BP and his HR is always slow at 40-50s without any complaints, refuse Hydralazine that never works for him, will reduce Croeg to 3.125mg  BID today , monitor for bradycardia   Hyperlipidemia  - LDL 87 from 03/30/21 - continue Crestor  20mg    CKD stage IIIa - sCr baseline appears 1.5-1.8 ranges since 2021 - 1.86 POA, now 1.72, eGFR 49 - non-oligiuria  - renal index stable, gentle IVF before cath today, repeat BMP tomorrow - limiting use of diuretics, ACEi/ARB     For questions or updates, please  contact [facility] Please consult [email or web address] for contact info under        Signed, Margie Billet, NP  04/01/2021, 11:21 AM

## 2021-04-01 NOTE — Discharge Summary (Signed)
Anthony Estrada HUT:[id number] DOB: 1976/08/15 DOA: 03/29/2021  PCP: Marcine Matar, MD  Admit date: 03/29/2021  Discharge date: 04/01/2021  Admitted From: Home  Disposition:  Home   Recommendations for Outpatient Follow-up:   Follow up with PCP in 1-2 weeks  PCP Please obtain BMP/CBC, 2 view CXR in 1week,  (see Discharge instructions)   PCP Please follow up on the following pending results: Needs outpt follow up with Pulmonary and Cards   Home Health: None   Equipment/Devices: None  Consultations: Cards Discharge Condition: Stable    CODE STATUS: Full    Diet Recommendation: Heart Healthy   Diet Order            Diet Heart Room service appropriate? Yes; Fluid consistency: Thin  Diet effective now           Diet - low sodium heart healthy                  Chief Complaint  Patient presents with  . Shortness of Breath     Brief history of present illness from the day of admission and additional interim summary    Patient in bed, appears comfortable, denies any headache, no fever, no chest pain or pressure, no shortness of breath , no abdominal pain. No new focal weakness.                                                                 Hospital Course     1. NSTEMI  -  Likely due to cocaine abuse, Managed by Cards, L.Heart Cath with non occlusive CAD, continue medical management, placed on B Blocker, Statin, ASA & Imdur follow with PCP and Cards, now symptom free.  2. Cocaine abuse.  Counseled to quit.  3. HTN - placed on Norvasc, Coreg by Cards added Imdur for better control, stable.  4. AKI on CKD 3B -baseline creatinine around 1.7, AKI resolved, needed gentle hydration, back to baseline.   5. Left upper lobe lung nodule.  Outpatient pulmonary follow-up with   Discharge diagnosis      Principal Problem:   NSTEMI (non-ST elevated myocardial infarction) (HCC) Active Problems:   Stage 3 chronic kidney disease (HCC)   Chest pain   Elevated troponin    Discharge instructions    Discharge Instructions    Diet - low sodium heart healthy   Complete by: As directed    Discharge instructions   Complete by: As directed    Follow with Primary MD Marcine Matar, MD Pulmonary and Cardiology physicians in 7 - 10 days   Get CBC, CMP, 2 view Chest X ray -  checked next visit within 1 week by Primary MD   Activity: As tolerated with Full fall precautions use walker/cane & assistance as needed  Disposition Home    Diet: Heart Healthy    Special Instructions: If you have smoked or chewed Tobacco  in the last 2 yrs please stop smoking, stop any regular Alcohol  and or any Recreational drug use including any Cocaine use.  On your next visit with your primary care physician please Get Medicines reviewed and adjusted.  Please request your Prim.MD to go over all Hospital Tests and Procedure/Radiological results at the follow up, please get all Hospital records sent to your Prim MD by signing hospital release before you go home.  If you experience worsening of your admission symptoms, develop shortness of breath, life threatening emergency, suicidal or homicidal thoughts you must seek medical attention immediately by calling 911 or calling your MD immediately  if symptoms less severe.  You Must read complete instructions/literature along with all the possible adverse reactions/side effects for all the Medicines you take and that have been prescribed to you. Take any new Medicines after you have completely understood and accpet all the possible adverse reactions/side effects.   Increase activity slowly   Complete by: As directed       Discharge Medications   Allergies as of 04/01/2021      Reactions   Codeine Itching   Shellfish Allergy Swelling      Medication List     TAKE these medications   amLODipine 10 MG tablet Commonly known as: NORVASC Take 1 tablet (10 mg total) by mouth daily.   aspirin 81 MG EC tablet Take 1 tablet (81 mg total) by mouth daily. Swallow whole. Start taking on: Apr 02, 2021 What changed:   medication strength  how much to take  additional instructions   carvedilol 3.125 MG tablet Commonly known as: COREG Take 1 tablet (3.125 mg total) by mouth 2 (two) times daily with a meal.   isosorbide mononitrate 60 MG 24 hr tablet Commonly known as: IMDUR Take 1 tablet (60 mg total) by mouth daily. Start taking on: Apr 02, 2021   nitroGLYCERIN 0.4 MG SL tablet Commonly known as: NITROSTAT Place 1 tablet (0.4 mg total) under the tongue every 5 (five) minutes as needed for chest pain.   rosuvastatin 20 MG tablet Commonly known as: CRESTOR Take 1 tablet (20 mg total) by mouth daily. Start taking on: Apr 02, 2021        Follow-up Information    Marcine Matar, MD. Schedule an appointment as soon as possible for a visit on 05/17/2021.   Specialty: Internal Medicine Why: @ 2:30 pm for hospital follow up appointment. Please call the office to reschedule if you cannot make this scheduled appointment.  Contact information: [address] [place] [address] [phone]        Laurann Montana, PA-C Follow up on 05/08/2021.   Specialties: Cardiology, Radiology Why: Please arrive 15 min early to 10:45am for your post hospital follow up appointment with cardiology Contact information: [address] [place] [address] [phone]        Kalman Shan, MD. Schedule an appointment as soon as possible for a visit in 1 week(s).   Specialty: Pulmonary Disease Why: Lung Nodule Contact information: [address] [place] [address] [phone]               Major procedures and Radiology Reports - PLEASE review detailed and final reports thoroughly  -       CT  Angio Chest PE W and/or Wo Contrast  Result Date: 03/29/2021 CLINICAL DATA:  Shortness of breath EXAM: CT ANGIOGRAPHY CHEST WITH CONTRAST TECHNIQUE: Multidetector CT imaging of the chest was performed using the standard protocol during bolus administration of intravenous contrast. Multiplanar CT image reconstructions and MIPs were obtained to evaluate the vascular anatomy. CONTRAST:  OMNIPAQUE IOHEXOL 350 MG/ML SOLN COMPARISON:  Chest radiography same day.  CT 10/29/2020. FINDINGS: Cardiovascular: Heart size is normal. No coronary artery calcification. Very minimal thoracic aortic atherosclerotic calcification. Pulmonary arterial opacification is good. There are no pulmonary emboli. Mediastinum/Nodes: No mass or lymphadenopathy. Lungs/Pleura: Redemonstration of a 1.8 x 1.6 cm well-circumscribed nodule in the left upper lobe, stable since 11/22/2019. This is favored to represent a benign nodule. However, follow-up through December of 2022 is recommended as described previously to achieve 24 month follow-up. No pleural effusion. No second pulmonary parenchymal finding. Upper Abdomen: Normal Musculoskeletal: No significant skeletal finding. Review of the MIP images confirms the above findings. IMPRESSION: 1. No pulmonary emboli or other acute chest pathology. 2. Redemonstration of a 1.8 x 1.6 cm well-circumscribed nodule in the left upper lobe, stable since 11/22/2019. This is favored to represent a benign nodule. Follow-up through December of 2022 suggested, as described previously, to achieve 24 month follow-up. 3. Aortic atherosclerosis. Aortic Atherosclerosis (ICD10-I70.0). Electronically Signed   By: Paulina Fusi M.D.   On: 03/29/2021 13:20   CARDIAC CATHETERIZATION  Addendum Date: 04/01/2021    LV end diastolic pressure is moderately elevated.  There is no aortic valve stenosis.  No angiographically apparent CAD.  Tortuous right subclavian artery made torquing catheters more difficult. AL1 needed  to cross aortic valve and for engaging the RCA.  Continue preventive therapy.  Stopping cocaine use was discussed after the case. Marland Kitchen    Result Date: 04/01/2021  LV end diastolic pressure is moderately elevated.  There is no aortic valve stenosis.  No angiographically apparent CAD.  Continue preventive therapy.  Stopping cocaine use was discussed after the case. .    DG Chest Portable 1 View  Result Date: 03/29/2021 CLINICAL DATA:  Shortness of breath.  Chest pressure. EXAM: PORTABLE CHEST 1 VIEW COMPARISON:  August 28, 2020. FINDINGS: Mild enlargement the cardiac silhouette, likely accentuated by AP portable technique. No consolidation. Similar size of an approximately 1.6 cm left upper lobe for a nodule, better characterized on CT chest from November 29, 21. No visible pleural effusions or pneumothorax. No acute osseous abnormality. IMPRESSION: 1. No evidence of acute cardiopulmonary disease. 2. Similar size of an approximately 1.6 cm left upper lobe for a nodule, better characterized on CT chest from November 29, 21. Electronically Signed   By: Feliberto Harts MD   On: 03/29/2021 11:15   ECHOCARDIOGRAM COMPLETE  Result Date: 03/30/2021    ECHOCARDIOGRAM REPORT   Patient Name:   MARQUELLE MUSGRAVE Date of Exam: 03/30/2021 Medical Rec #:  [id number]        Height:       64.0 in Accession #:    [id number]       Weight:       216.1 lb Date of Birth:  January 07, 1976         BSA:          2.022 m Patient Age:    45 years         BP:           156/99 mmHg Patient Gender: M                HR:  78 bpm. Exam Location:  Inpatient Procedure: 2D Echo, Cardiac Doppler and Color Doppler Indications:    Chest pain  History:        Patient has no prior history of Echocardiogram examinations.                 Signs/Symptoms:Shortness of Breath; Risk Factors:Hypertension                 and Dyslipidemia. Cocaine abuse.  Sonographer:    Ross LudwigArthur Guy RDCS (AE) Referring Phys: [id number] MICHAEL E NORINS IMPRESSIONS  1. Left  ventricular ejection fraction, by estimation, is 65 to 70%. The left ventricle has normal function. The left ventricle has no regional wall motion abnormalities. There is moderate left ventricular hypertrophy. Left ventricular diastolic parameters are consistent with Grade I diastolic dysfunction (impaired relaxation). Elevated left ventricular end-diastolic pressure. The E/e' is 15.  2. Right ventricular systolic function is normal. The right ventricular size is normal. Tricuspid regurgitation signal is inadequate for assessing PA pressure.  3. Left atrial size was moderately dilated.  4. The mitral valve is grossly normal. Trivial mitral valve regurgitation.  5. The aortic valve is tricuspid. Aortic valve regurgitation is trivial. Mild aortic valve sclerosis is present, with no evidence of aortic valve stenosis.  6. The inferior vena cava is normal in size with <50% respiratory variability, suggesting right atrial pressure of 8 mmHg. Comparison(s): No prior Echocardiogram. FINDINGS  Left Ventricle: Left ventricular ejection fraction, by estimation, is 65 to 70%. The left ventricle has normal function. The left ventricle has no regional wall motion abnormalities. The left ventricular internal cavity size was normal in size. There is  moderate left ventricular hypertrophy. Left ventricular diastolic parameters are consistent with Grade I diastolic dysfunction (impaired relaxation). Elevated left ventricular end-diastolic pressure. The E/e' is 15. Right Ventricle: The right ventricular size is normal. No increase in right ventricular wall thickness. Right ventricular systolic function is normal. Tricuspid regurgitation signal is inadequate for assessing PA pressure. Left Atrium: Left atrial size was moderately dilated. Right Atrium: Right atrial size was normal in size. Pericardium: There is no evidence of pericardial effusion. Mitral Valve: The mitral valve is grossly normal. Trivial mitral valve regurgitation. MV  peak gradient, 4.5 mmHg. The mean mitral valve gradient is 2.0 mmHg. Tricuspid Valve: The tricuspid valve is grossly normal. Tricuspid valve regurgitation is trivial. Aortic Valve: The aortic valve is tricuspid. Aortic valve regurgitation is trivial. Mild aortic valve sclerosis is present, with no evidence of aortic valve stenosis. Aortic valve mean gradient measures 8.0 mmHg. Aortic valve peak gradient measures 15.1 mmHg. Aortic valve area, by VTI measures 2.50 cm. Pulmonic Valve: The pulmonic valve was grossly normal. Pulmonic valve regurgitation is not visualized. Aorta: The aortic root and ascending aorta are structurally normal, with no evidence of dilitation. Venous: The inferior vena cava is normal in size with less than 50% respiratory variability, suggesting right atrial pressure of 8 mmHg. IAS/Shunts: No atrial level shunt detected by color flow Doppler.  LEFT VENTRICLE PLAX 2D LVIDd:         5.10 cm  Diastology LVIDs:         2.90 cm  LV e' medial:    5.77 cm/s LV PW:         1.70 cm  LV E/e' medial:  15.0 LV IVS:        1.50 cm  LV e' lateral:   5.77 cm/s LVOT diam:     2.00 cm  LV E/e' lateral:  15.0 LV SV:         85 LV SV Index:   42 LVOT Area:     3.14 cm  RIGHT VENTRICLE             IVC RV S prime:     16.30 cm/s  IVC diam: 1.60 cm TAPSE (M-mode): 3.1 cm LEFT ATRIUM             Index       RIGHT ATRIUM           Index LA diam:        4.30 cm 2.13 cm/m  RA Area:     20.60 cm LA Vol (A2C):   76.9 ml 38.04 ml/m RA Volume:   57.10 ml  28.24 ml/m LA Vol (A4C):   80.1 ml 39.62 ml/m LA Biplane Vol: 82.4 ml 40.76 ml/m  AORTIC VALVE AV Area (Vmax):    2.46 cm AV Area (Vmean):   2.27 cm AV Area (VTI):     2.50 cm AV Vmax:           194.00 cm/s AV Vmean:          132.000 cm/s AV VTI:            0.339 m AV Peak Grad:      15.1 mmHg AV Mean Grad:      8.0 mmHg LVOT Vmax:         152.00 cm/s LVOT Vmean:        95.200 cm/s LVOT VTI:          0.270 m LVOT/AV VTI ratio: 0.80  AORTA Ao Root diam: 3.50 cm Ao  Asc diam:  3.20 cm MITRAL VALVE MV Area (PHT): 3.12 cm    SHUNTS MV Area VTI:   2.37 cm    Systemic VTI:  0.27 m MV Peak grad:  4.5 mmHg    Systemic Diam: 2.00 cm MV Mean grad:  2.0 mmHg MV Vmax:       1.06 m/s MV Vmean:      68.7 cm/s MV Decel Time: 243 msec MV E velocity: 86.50 cm/s MV A velocity: 68.10 cm/s MV E/A ratio:  1.27 Zoila Shutter MD Electronically signed by Zoila Shutter MD Signature Date/Time: 03/30/2021/12:23:28 PM    Final     Micro Results     Recent Results (from the past 240 hour(s))  Resp Panel by RT-PCR (Flu A&B, Covid) Nasopharyngeal Swab     Status: None   Collection Time: 03/29/21 10:46 AM   Specimen: Nasopharyngeal Swab; Nasopharyngeal(NP) swabs in vial transport medium  Result Value Ref Range Status   SARS Coronavirus 2 by RT PCR NEGATIVE NEGATIVE Final    Comment: (NOTE) SARS-CoV-2 target nucleic acids are NOT DETECTED.  The SARS-CoV-2 RNA is generally detectable in upper respiratory specimens during the acute phase of infection. The lowest concentration of SARS-CoV-2 viral copies this assay can detect is 138 copies/mL. A negative result does not preclude SARS-Cov-2 infection and should not be used as the sole basis for treatment or other patient management decisions. A negative result may occur with  improper specimen collection/handling, submission of specimen other than nasopharyngeal swab, presence of viral mutation(s) within the areas targeted by this assay, and inadequate number of viral copies(<138 copies/mL). A negative result must be combined with clinical observations, patient history, and epidemiological information. The expected result is Negative.  Fact Sheet for Patients:  BloggerCourse.com  Fact Sheet for Healthcare Providers:  SeriousBroker.it  This  test is no t yet approved or cleared by the [place] and  has been authorized for detection and/or diagnosis of SARS-CoV-2 by FDA  under an Emergency Use Authorization (EUA). This EUA will remain  in effect (meaning this test can be used) for the duration of the COVID-19 declaration under Section 564(b)(1) of the Act, 21 U.S.C.section 360bbb-3(b)(1), unless the authorization is terminated  or revoked sooner.       Influenza A by PCR NEGATIVE NEGATIVE Final   Influenza B by PCR NEGATIVE NEGATIVE Final    Comment: (NOTE) The Xpert Xpress SARS-CoV-2/FLU/RSV plus assay is intended as an aid in the diagnosis of influenza from Nasopharyngeal swab specimens and should not be used as a sole basis for treatment. Nasal washings and aspirates are unacceptable for Xpert Xpress SARS-CoV-2/FLU/RSV testing.  Fact Sheet for Patients: BloggerCourse.com  Fact Sheet for Healthcare Providers: SeriousBroker.it  This test is not yet approved or cleared by the [place] FDA and has been authorized for detection and/or diagnosis of SARS-CoV-2 by FDA under an Emergency Use Authorization (EUA). This EUA will remain in effect (meaning this test can be used) for the duration of the COVID-19 declaration under Section 564(b)(1) of the Act, 21 U.S.C. section 360bbb-3(b)(1), unless the authorization is terminated or revoked.  Performed at [facility], [address] [place], [place] [address]     Today   Subjective    Anthony Estrada today has no headache,no chest abdominal pain,no new weakness tingling or numbness, feels much better wants to go home today.     Objective   Blood pressure 130/86, pulse 51, temperature (!) 97.2 F (36.2 C), temperature source Oral, resp. rate 18, height  (1.626 m), weight 98 kg, SpO2 100 %.   Intake/Output Summary (Last 24 hours) at 04/01/2021 1525 Last data filed at 03/31/2021 2000 Gross per 24 hour  Intake 150 ml  Output --  Net 150 ml    Exam  Awake Alert, No new F.N deficits, Normal affect [place].AT,PERRAL Supple  Neck,No JVD, No cervical lymphadenopathy appriciated.  Symmetrical Chest wall movement, Good air movement bilaterally, CTAB RRR,No Gallops,Rubs or new Murmurs, No Parasternal Heave +ve B.Sounds, Abd Soft, Non tender, No organomegaly appriciated, No rebound -guarding or rigidity. No Cyanosis, Clubbing or edema, No new Rash or bruise   Data Review   CBC w Diff:  Lab Results  Component Value Date   WBC 4.9 04/01/2021   HGB 12.7 (L) 04/01/2021   HGB 13.6 04/19/2020   HCT 38.0 (L) 04/01/2021   HCT 41.8 04/19/2020   PLT 168 04/01/2021   PLT 209 04/19/2020   LYMPHOPCT 49 04/01/2021   MONOPCT 11 04/01/2021   EOSPCT 3 04/01/2021   BASOPCT 0 04/01/2021    CMP:  Lab Results  Component Value Date   NA 140 04/01/2021   NA 139 09/13/2020   K 4.0 04/01/2021   CL 106 04/01/2021   CO2 27 04/01/2021   BUN 15 04/01/2021   BUN 21 09/13/2020   CREATININE 1.72 (H) 04/01/2021   PROT 5.8 (L) 04/01/2021   ALBUMIN 3.2 (L) 04/01/2021   ALBUMIN 4.2 04/19/2020   BILITOT 0.6 04/01/2021   ALKPHOS 40 04/01/2021   AST 17 04/01/2021   ALT 13 04/01/2021  .   Total Time in preparing paper work, data evaluation and todays exam - 35 minutes  Susa Raring M.D on 04/01/2021 at 3:25 PM  [facility]

## 2021-04-01 NOTE — H&P (View-Only) (Signed)
The patient has been seen in conjunction with Karl Bales, NP. All aspects of care have been considered and discussed. The patient has been personally interviewed, examined, and all clinical data has been reviewed.   Hypertrophic cardiomyopathy, likely physiologic related heavy workout schedule/hypertension versus pathologic.  There is a possibility that he has a wild-type mutation.  If hypertrophy is physiologic, ARB and diuretic therapy may be helpful.  Heart rate being relatively low limits the use of beta-blocker therapy.  Elevated troponin I in the setting of cocaine use and hypertrophic cardiomyopathy likely resulting in type II myocardial injury.  It is appropriate to rule out obstructive coronary disease given the patient's risk factors which include hypertension/tobacco use/CKD.  If coronaries are clean, had some future time, cardiac MRI might be helpful in determining if he has physiologic versus pathologic hypertrophy.  If he does not get the stent, is able ambulate well, and we have reasonable blood pressure control, may be able to discharge later today.  Refused to discuss substance use today while prep parents were in the room.   Progress Note  Patient Name: Anthony Estrada Date of Encounter: 04/01/2021  [facility] Cardiologist: New to Dr Tamala Julian   Subjective   Patient states he never had chest pain. He is no longer feeling SOB.He has questions regarding his cardiac catheterization and wishes to know what are the risks and if stents are being placed. He states he is feeling well, getting ready to take a shower. Staff RN reports patient's Coreg was increased and his heart rate has been dropping to 40s without symptoms.    Inpatient Medications    Scheduled Meds: . amLODipine  10 mg Oral Daily  . aspirin EC  81 mg Oral Daily  . carvedilol  3.125 mg Oral BID WC  . isosorbide mononitrate  60 mg Oral Daily  . rosuvastatin  20 mg Oral Daily   Continuous Infusions: .  sodium chloride 1 mL/kg/hr (04/01/21 0634)  . heparin 1,750 Units/hr (03/31/21 2111)   PRN Meds: acetaminophen, hydrALAZINE, nitroGLYCERIN, ondansetron (ZOFRAN) IV   Vital Signs    Vitals:   03/31/21 1606 03/31/21 2030 04/01/21 0439 04/01/21 0926  BP: 131/85 (!) 141/90 137/80 (!) 144/102  Pulse: (!) 107 72 (!) 49   Resp: $Remo'18 18 18   'TdziL$ Temp: 98.2 F (36.8 C) 98.1 F (36.7 C) 98.3 F (36.8 C)   TempSrc: Oral Oral Oral   SpO2: 99% 100%    Weight:      Height:        Intake/Output Summary (Last 24 hours) at 04/01/2021 1121 Last data filed at 03/31/2021 2000 Gross per 24 hour  Intake 150 ml  Output --  Net 150 ml   Last 3 Weights 03/30/2021 03/29/2021 03/29/2021  Weight (lbs) 216 lb 1.6 oz 216 lb 8 oz 223 lb 5.2 oz  Weight (kg) 98.022 kg 98.204 kg 101.3 kg      Telemetry    Sinus rhythm with HR 80s, artifacts, bradycardia to 50s noted - Personally Reviewed  ECG    No new tracing - Personally Reviewed  Physical Exam   GEN: No acute distress. Sitting in bed.   Neck: No JVD Cardiac: RRR, no murmurs, rubs, or gallops.  Respiratory: Clear to auscultation bilaterally. On room air.  GI: Soft, nontender, non-distended  MS: No BLE edema; No deformity. Neuro:  Alert and oriented x3, follow commands appopriately Psych: Normal affect   Labs    High Sensitivity Troponin:   Recent  Labs  Lab 03/29/21 1046 03/29/21 1235 03/29/21 1557 03/29/21 1845  TROPONINIHS 80* 273* 904* 1,014*      Chemistry Recent Labs  Lab 03/29/21 1046 03/30/21 0434 03/31/21 0124 04/01/21 0306  NA 136 138 139 140  K 3.0* 3.9 4.0 4.0  CL 105 105 103 106  CO2 $Re'24 26 29 27  'hiX$ GLUCOSE 101* 89 121* 111*  BUN 26* $Remov'19 20 15  'FcMeCm$ CREATININE 1.86* 1.75* 1.85* 1.72*  CALCIUM 8.8* 8.7* 8.8* 8.8*  PROT 6.6  --  5.8* 5.8*  ALBUMIN 3.4*  --  3.3* 3.2*  AST 21  --  17 17  ALT 13  --  12 13  ALKPHOS 45  --  43 40  BILITOT 0.4  --  0.4 0.6  GFRNONAA 45* 48* 45* 49*  ANIONGAP $RemoveB'7 7 7 7     'xgMOfPaU$ Hematology Recent  Labs  Lab 03/30/21 0434 03/31/21 0124 04/01/21 0306  WBC 4.2 4.8 4.9  RBC 4.41 4.21* 4.07*  HGB 13.6 13.2 12.7*  HCT 40.3 39.4 38.0*  MCV 91.4 93.6 93.4  MCH 30.8 31.4 31.2  MCHC 33.7 33.5 33.4  RDW 14.3 14.1 13.9  PLT 183 195 168    BNP Recent Labs  Lab 03/29/21 1046 03/31/21 0124 04/01/21 0306  BNP 216.6* 151.7* 267.0*     DDimer  Recent Labs  Lab 03/29/21 1046  DDIMER 1.10*     Radiology    ECHOCARDIOGRAM COMPLETE  Result Date: 03/30/2021    ECHOCARDIOGRAM REPORT   Patient Name:   JESSELEE POTH Date of Exam: 03/30/2021 Medical Rec #:  [id number]        Height:       64.0 in Accession #:    [id number]       Weight:       216.1 lb Date of Birth:  Mar 10, 1976         BSA:          2.022 m Patient Age:    45 years         BP:           156/99 mmHg Patient Gender: M                HR:           78 bpm. Exam Location:  Inpatient Procedure: 2D Echo, Cardiac Doppler and Color Doppler Indications:    Chest pain  History:        Patient has no prior history of Echocardiogram examinations.                 Signs/Symptoms:Shortness of Breath; Risk Factors:Hypertension                 and Dyslipidemia. Cocaine abuse.  Sonographer:    Clayton Lefort RDCS (AE) Referring Phys: Chunky  1. Left ventricular ejection fraction, by estimation, is 65 to 70%. The left ventricle has normal function. The left ventricle has no regional wall motion abnormalities. There is moderate left ventricular hypertrophy. Left ventricular diastolic parameters are consistent with Grade I diastolic dysfunction (impaired relaxation). Elevated left ventricular end-diastolic pressure. The E/e' is 20.  2. Right ventricular systolic function is normal. The right ventricular size is normal. Tricuspid regurgitation signal is inadequate for assessing PA pressure.  3. Left atrial size was moderately dilated.  4. The mitral valve is grossly normal. Trivial mitral valve regurgitation.  5. The aortic valve is  tricuspid. Aortic valve regurgitation is trivial. Mild  aortic valve sclerosis is present, with no evidence of aortic valve stenosis.  6. The inferior vena cava is normal in size with <50% respiratory variability, suggesting right atrial pressure of 8 mmHg. Comparison(s): No prior Echocardiogram. FINDINGS  Left Ventricle: Left ventricular ejection fraction, by estimation, is 65 to 70%. The left ventricle has normal function. The left ventricle has no regional wall motion abnormalities. The left ventricular internal cavity size was normal in size. There is  moderate left ventricular hypertrophy. Left ventricular diastolic parameters are consistent with Grade I diastolic dysfunction (impaired relaxation). Elevated left ventricular end-diastolic pressure. The E/e' is 15. Right Ventricle: The right ventricular size is normal. No increase in right ventricular wall thickness. Right ventricular systolic function is normal. Tricuspid regurgitation signal is inadequate for assessing PA pressure. Left Atrium: Left atrial size was moderately dilated. Right Atrium: Right atrial size was normal in size. Pericardium: There is no evidence of pericardial effusion. Mitral Valve: The mitral valve is grossly normal. Trivial mitral valve regurgitation. MV peak gradient, 4.5 mmHg. The mean mitral valve gradient is 2.0 mmHg. Tricuspid Valve: The tricuspid valve is grossly normal. Tricuspid valve regurgitation is trivial. Aortic Valve: The aortic valve is tricuspid. Aortic valve regurgitation is trivial. Mild aortic valve sclerosis is present, with no evidence of aortic valve stenosis. Aortic valve mean gradient measures 8.0 mmHg. Aortic valve peak gradient measures 15.1 mmHg. Aortic valve area, by VTI measures 2.50 cm. Pulmonic Valve: The pulmonic valve was grossly normal. Pulmonic valve regurgitation is not visualized. Aorta: The aortic root and ascending aorta are structurally normal, with no evidence of dilitation. Venous: The  inferior vena cava is normal in size with less than 50% respiratory variability, suggesting right atrial pressure of 8 mmHg. IAS/Shunts: No atrial level shunt detected by color flow Doppler.  LEFT VENTRICLE PLAX 2D LVIDd:         5.10 cm  Diastology LVIDs:         2.90 cm  LV e' medial:    5.77 cm/s LV PW:         1.70 cm  LV E/e' medial:  15.0 LV IVS:        1.50 cm  LV e' lateral:   5.77 cm/s LVOT diam:     2.00 cm  LV E/e' lateral: 15.0 LV SV:         85 LV SV Index:   42 LVOT Area:     3.14 cm  RIGHT VENTRICLE             IVC RV S prime:     16.30 cm/s  IVC diam: 1.60 cm TAPSE (M-mode): 3.1 cm LEFT ATRIUM             Index       RIGHT ATRIUM           Index LA diam:        4.30 cm 2.13 cm/m  RA Area:     20.60 cm LA Vol (A2C):   76.9 ml 38.04 ml/m RA Volume:   57.10 ml  28.24 ml/m LA Vol (A4C):   80.1 ml 39.62 ml/m LA Biplane Vol: 82.4 ml 40.76 ml/m  AORTIC VALVE AV Area (Vmax):    2.46 cm AV Area (Vmean):   2.27 cm AV Area (VTI):     2.50 cm AV Vmax:           194.00 cm/s AV Vmean:          132.000 cm/s AV VTI:  0.339 m AV Peak Grad:      15.1 mmHg AV Mean Grad:      8.0 mmHg LVOT Vmax:         152.00 cm/s LVOT Vmean:        95.200 cm/s LVOT VTI:          0.270 m LVOT/AV VTI ratio: 0.80  AORTA Ao Root diam: 3.50 cm Ao Asc diam:  3.20 cm MITRAL VALVE MV Area (PHT): 3.12 cm    SHUNTS MV Area VTI:   2.37 cm    Systemic VTI:  0.27 m MV Peak grad:  4.5 mmHg    Systemic Diam: 2.00 cm MV Mean grad:  2.0 mmHg MV Vmax:       1.06 m/s MV Vmean:      68.7 cm/s MV Decel Time: 243 msec MV E velocity: 86.50 cm/s MV A velocity: 68.10 cm/s MV E/A ratio:  1.27 Lyman Bishop MD Electronically signed by Lyman Bishop MD Signature Date/Time: 03/30/2021/12:23:28 PM    Final     Cardiac Studies   Echo from 03/30/21:  1. Left ventricular ejection fraction, by estimation, is 65 to 70%. The  left ventricle has normal function. The left ventricle has no regional  wall motion abnormalities. There is moderate  left ventricular hypertrophy.  Left ventricular diastolic parameters are consistent with Grade I diastolic dysfunction (impaired relaxation). Elevated left ventricular end-diastolic pressure. The E/e' is  35.  2. Right ventricular systolic function is normal. The right ventricular  size is normal. Tricuspid regurgitation signal is inadequate for assessing  PA pressure.  3. Left atrial size was moderately dilated.  4. The mitral valve is grossly normal. Trivial mitral valve  regurgitation.  5. The aortic valve is tricuspid. Aortic valve regurgitation is trivial.  Mild aortic valve sclerosis is present, with no evidence of aortic valve  stenosis.  6. The inferior vena cava is normal in size with <50% respiratory  variability, suggesting right atrial pressure of 8 mmHg.    Patient Profile     45 y.o. male with PMH of HTN, tobacco abuse, cocaine abuse, who presented here for SOB, found to have NSTEMI, transferred from [facility] to [facility] for further management. Cardiology is consulted and following  for NSTEMI.   Assessment & Plan    NSTEMI - presented to [facility] ER for SOB, transferred to [facility] trop 80 >273 >904 >1014 - BNP 216 >151 >267 - EKG TWI noted of inferolateral leads, non-specific change comparing to 08/2020 EKG - pending LHC today , risk versus benefit discussed with the patient at bedside, patient consented for the procedure and voiced understanding  - continue ASA, heparin gtt, and statin for medical therapy now   Cocaine abuse - recommend cessation   Hypertension - BP elevated 131/85 - 152/97 over past 24 hours - continue Amlodipine 10mg  daily - patient refuse discontinue Coreg, discussed his bradycardia 40-50s, he states it works well for his BP and his HR is always slow at 40-50s without any complaints, refuse Hydralazine that never works for him, will reduce Croeg to 3.125mg  BID today , monitor for bradycardia   Hyperlipidemia  - LDL 87 from 03/30/21 - continue Crestor  20mg    CKD stage IIIa - sCr baseline appears 1.5-1.8 ranges since 2021 - 1.86 POA, now 1.72, eGFR 49 - non-oligiuria  - renal index stable, gentle IVF before cath today, repeat BMP tomorrow - limiting use of diuretics, ACEi/ARB     For questions or updates, please  contact [facility] Please consult [email or web address] for contact info under        Signed, Margie Billet, NP  04/01/2021, 11:21 AM

## 2021-04-01 NOTE — Discharge Instructions (Signed)
Follow with Primary MD Marcine Matar, MD Pulmonary and Cardiology physicians in 7 - 10 days   Get CBC, CMP, 2 view Chest X ray -  checked next visit within 1 week by Primary MD   Activity: As tolerated with Full fall precautions use walker/cane & assistance as needed  Disposition Home    Diet: Heart Healthy    Special Instructions: If you have smoked or chewed Tobacco  in the last 2 yrs please stop smoking, stop any regular Alcohol  and or any Recreational drug use including any Cocaine use.  On your next visit with your primary care physician please Get Medicines reviewed and adjusted.  Please request your Prim.MD to go over all Hospital Tests and Procedure/Radiological results at the follow up, please get all Hospital records sent to your Prim MD by signing hospital release before you go home.  If you experience worsening of your admission symptoms, develop shortness of breath, life threatening emergency, suicidal or homicidal thoughts you must seek medical attention immediately by calling 911 or calling your MD immediately  if symptoms less severe.  You Must read complete instructions/literature along with all the possible adverse reactions/side effects for all the Medicines you take and that have been prescribed to you. Take any new Medicines after you have completely understood and accpet all the possible adverse reactions/side effects.                                                                                                                                               [facility]                            [address] [place], [place] [address]      Anthony Estrada was admitted to the Hospital on 03/29/2021 and Discharged  04/01/2021 and should be excused from work/school   for 10  days starting from date -  03/29/2021 , may return to work/school without any restrictions.  Call  Susa Raring MD, [facility]  [phone] with questions.  Susa Raring M.D on 04/01/2021,at 3:19 PM  [facility]   Office  [phone]

## 2021-04-01 NOTE — Care Management (Signed)
1140 04-01-21 Case Manager spoke with patient regarding insurance. Patient is unclear if his insurance has been initiated. Patient will check with his HR from [facility] and [employer] to see if it has began. Patient is aware to call admitting if the insurance is active. Patient is currently without a primary care provider. Patient is agreeable to have Case Manager schedule an appointment with the [facility]. Appointment will be placed on the AVS. Patient may benefit from [facility] if unable to pay for medications out of pocket. Case Manager will continue to follow for additional transition of care needs. Graves[place]Bigel[place], Lamar Laundry, RN, BSN Case Manager

## 2021-04-02 ENCOUNTER — Encounter (HOSPITAL_COMMUNITY): Payer: Self-pay | Admitting: Interventional Cardiology

## 2021-04-02 ENCOUNTER — Other Ambulatory Visit: Payer: Self-pay

## 2021-04-02 ENCOUNTER — Telehealth: Payer: Self-pay

## 2021-04-02 NOTE — Telephone Encounter (Signed)
Transition Care Management Unsuccessful Follow-up Telephone Call  Date of discharge and from where:  04/01/2021, [facility]   Attempts:  1st Attempt  Reason for unsuccessful TCM follow-up call:  Left voice message on # [phone].  Patient has appointment with Dr Alvis Lemmings 05/17/2021. If he returns the call, need to inquire if he would like to schedule an appointment to be seen sooner - with another provider, at another community care clinic

## 2021-04-03 ENCOUNTER — Telehealth: Payer: Self-pay

## 2021-04-03 NOTE — Telephone Encounter (Signed)
Transition Care Management Follow-up Telephone Call  Date of discharge and from where:[facility] 04/01/2021 How have you been since you were released from the hospital? Better than in the hospital Any questions or concerns? No questions/concerns reported.  Items Reviewed: Did the pt receive and understand the discharge instructions provided? have the instructions and have no questions.  Medications obtained and verified?  said that  have the medication list  and the hospital staff reviewed them in detail prior to discharge. Pt said that he has all of the medications and  have no questions.  Any new allergies since your discharge? None reported  Do you have support at home? No Other (ie: DME, Home Health, etc)     None   Functional Questionnaire: (I = Independent and D = Dependent) ADL's:  Independent.      Follow up appointments reviewed:   PCP Hospital f/u appt confirmed? Dr Laural Benes @ 1050 (my chart call).  Specialist Hospital f/u appt confirmed?[facility] scheduled at this time  Are transportation arrangements needed? have transportation   If their condition worsens, is the pt aware to call  their PCP or go to the ED? Yes.Made pt aware if condition worsen or start experiencing rapid weight gain, chest pain, diff breathing, SOB, high fevers, or bleading to refer imediately to ED for further evaluation.  Was the patient provided with contact information for the PCP's office or ED? He has the phone number  Was the pt encouraged to call back with questions or concerns?yes

## 2021-04-23 ENCOUNTER — Telehealth: Payer: Self-pay | Admitting: Internal Medicine

## 2021-04-23 ENCOUNTER — Ambulatory Visit: Payer: Self-pay | Admitting: Internal Medicine

## 2021-04-23 ENCOUNTER — Other Ambulatory Visit: Payer: Self-pay

## 2021-04-23 NOTE — Telephone Encounter (Signed)
Called Patient to get him scheduled for an in person visit with Dr. Laural Benes. No answer and voicemail was left to call [phone] to get an appointment.

## 2021-05-06 ENCOUNTER — Encounter: Payer: Self-pay | Admitting: Physician Assistant

## 2021-05-06 NOTE — Progress Notes (Signed)
Cardiology Office Note    Date:  05/08/2021   ID:  Sherrell Puller, DOB 1976/01/23, MRN [id number]  PCP:  Marcine Matar, MD  Cardiologist:  Lesleigh Noe, MD  Electrophysiologist:  None   Chief Complaint: f/u cath  History of Present Illness:   Anthony Estrada is a 45 y.o. male with history of HTN, CKD stage IIIb, cocaine abuse, lung nodule, recent demand ischemia who presents for follow-up. He was recently admitted with SOB and sensation of lungs exploding, found to have troponin of 1k. 2D echo 03/30/21 EF 65-70%, moderate LVH, grade 1 DD, moderate LAE. Cardiac cath 04/01/21 with no evidence of CAD, +moderately elevated LVEDP. D-dimer was elvated but CTA showed no PE, + known LUL nodule stable since 11/2019, follow-up suggested through December of 2022 to ensure stability. Elevated troponin was felt due to demand ischemia in context of cocaine use 2 days prior to event.  He presents back to clinic today feeling well without any recurrent dyspnea. He denies ever having had chest pain. No edema, palpitations, orthopnea, syncope. He is not sure if he is taking amlodipine or not. We called his pharmacy and he did pick it up. He has not been checking his BP at home recently but plans to get a BP cuff. He reports he has been completely abstinent of cocaine and does not plan to ever use again. His girlfriend was on speakerphone for the conversation (he granted permission to discuss medical hx with her presence).  Labwork independently reviewed: 03/2021 Hgb 12.7, BNP 267, K 4.0, Cr 1.72, albumin 3.2, AST/ALT OK, LDL 87, troponin 1014, d-dimer elevated   Past Medical History:  Diagnosis Date  . Chronic kidney disease, stage 3b (HCC)   . Cocaine abuse (HCC)   . Demand ischemia (HCC)   . Hypertension   . LVH (left ventricular hypertrophy)   . Pulmonary nodule     Past Surgical History:  Procedure Laterality Date  . LEFT HEART CATH AND CORONARY ANGIOGRAPHY N/A 04/01/2021   Procedure:  LEFT HEART CATH AND CORONARY ANGIOGRAPHY;  Surgeon: Corky Crafts, MD;  Location: [facility] INVASIVE CV LAB;  Service: Cardiovascular;  Laterality: N/A;  . no past surgery      Current Medications: Current Meds  Medication Sig  . amLODipine (NORVASC) 10 MG tablet Take 1 tablet (10 mg total) by mouth daily.  Marland Kitchen aspirin 81 MG EC tablet Take 1 tablet (81 mg total) by mouth daily. Swallow whole.  . carvedilol (COREG) 3.125 MG tablet Take 1 tablet (3.125 mg total) by mouth 2 (two) times daily with a meal.  . isosorbide mononitrate (IMDUR) 60 MG 24 hr tablet Take 1 tablet (60 mg total) by mouth daily.  . nitroGLYCERIN (NITROSTAT) 0.4 MG SL tablet Place 1 tablet (0.4 mg total) under the tongue every 5 (five) minutes as needed for chest pain.  . rosuvastatin (CRESTOR) 20 MG tablet Take 1 tablet (20 mg total) by mouth daily.     Allergies:   Codeine and Shellfish allergy   Social History   Socioeconomic History  . Marital status: Single    Spouse name: Not on file  . Number of children: Not on file  . Years of education: Not on file  . Highest education level: Not on file  Occupational History  . Not on file  Tobacco Use  . Smoking status: Current Every Day Smoker    Packs/day: 1.00    Types: Cigars  . Smokeless tobacco: Never Used  Vaping Use  . Vaping Use: Never used  Substance and Sexual Activity  . Alcohol use: Yes    Comment: occasionally  . Drug use: Yes    Types: Cocaine    Comment: two days ago,  uses occasionally  . Sexual activity: Yes  Other Topics Concern  . Not on file  Social History Narrative  . Not on file   Social Determinants of Health   Financial Resource Strain: Not on file  Food Insecurity: Not on file  Transportation Needs: Not on file  Physical Activity: Not on file  Stress: Not on file  Social Connections: Not on file     Family History:  The patient's family history includes Diabetes in his father and mother; Hypertension in his father and  mother.  ROS:   Please see the history of present illness.  All other systems are reviewed and otherwise negative.    EKGs/Labs/Other Studies Reviewed:    Studies reviewed are outlined and summarized above. Reports included below if pertinent.  LHC 04/01/21   LV end diastolic pressure is moderately elevated.  There is no aortic valve stenosis.  No angiographically apparent CAD.  Tortuous right subclavian artery made torquing catheters more difficult. AL1 needed to cross aortic valve and for engaging the RCA.   Continue preventive therapy.  Stopping cocaine use was discussed after the case. .    2D echo 03/30/21 IMPRESSIONS    1. Left ventricular ejection fraction, by estimation, is 65 to 70%. The  left ventricle has normal function. The left ventricle has no regional  wall motion abnormalities. There is moderate left ventricular hypertrophy.  Left ventricular diastolic  parameters are consistent with Grade I diastolic dysfunction (impaired  relaxation). Elevated left ventricular end-diastolic pressure. The E/e' is  15.  2. Right ventricular systolic function is normal. The right ventricular  size is normal. Tricuspid regurgitation signal is inadequate for assessing  PA pressure.  3. Left atrial size was moderately dilated.  4. The mitral valve is grossly normal. Trivial mitral valve  regurgitation.  5. The aortic valve is tricuspid. Aortic valve regurgitation is trivial.  Mild aortic valve sclerosis is present, with no evidence of aortic valve  stenosis.  6. The inferior vena cava is normal in size with <50% respiratory  variability, suggesting right atrial pressure of 8 mmHg.   Comparison(s): No prior Echocardiogram.     EKG:  EKG is not ordered today  Recent Labs: 04/01/2021: ALT 13; B Natriuretic Peptide 267.0; BUN 15; Creatinine, Ser 1.72; Hemoglobin 12.7; Magnesium 2.1; Platelets 168; Potassium 4.0; Sodium 140  Recent Lipid Panel    Component Value  Date/Time   CHOL 145 03/30/2021 0434   CHOL 154 09/13/2020 0928   TRIG 62 03/30/2021 0434   HDL 46 03/30/2021 0434   HDL 58 09/13/2020 0928   CHOLHDL 3.2 03/30/2021 0434   VLDL 12 03/30/2021 0434   LDLCALC 87 03/30/2021 0434   LDLCALC 84 09/13/2020 0928    PHYSICAL EXAM:    VS:  BP (!) 154/94   Pulse 63   Ht 5\' 4"  (1.626 m)   Wt 223 lb (101.2 kg)   SpO2 98%   BMI 38.28 kg/m   BMI: Body mass index is 38.28 kg/m.  GEN: Well nourished, well developed male in no acute distress HEENT: normocephalic, atraumatic Neck: no JVD, carotid bruits, or masses Cardiac: RRR; no murmurs, rubs, or gallops, no edema  Respiratory:  clear to auscultation bilaterally, normal work of breathing GI: soft, nontender,  nondistended, + BS MS: no deformity or atrophy Skin: warm and dry, no rash, right radial cath site without hematoma or ecchymosis; good pulse. Neuro:  Alert and Oriented x 3, Strength and sensation are intact, follows commands Psych: euthymic mood, full affect  Wt Readings from Last 3 Encounters:  05/08/21 223 lb (101.2 kg)  03/30/21 216 lb 1.6 oz (98 kg)  02/12/21 224 lb (101.6 kg)     ASSESSMENT & PLAN:   1. Demand ischemia - likely incited by cocaine/vasospasm and hypertension. Cath without significant CAD. Continue ASA, BB, Imdur, amlodipine, statin. He is aware of need to avoid tobacco - smokes cigars. Would suggest follow-up with primary care for lipid management as he otherwise does not have atherosclerotic disease.  2. Essential HTN - Suboptimal blood pressure control noted today. We need a better idea of what it's running at home. It is also not clear if he is taking his amlodipine. He plans to review his medicines when he gets home. His BP was running 130s-140s when he was on this regimen in the hospital. We discussed use of a pillbox but he does not think this would work well for him. The patient was provided instructions on monitoring blood pressure at home for 3-4 days and  relaying results to our office - he plans to obtain a cuff. Will also check TSH and repeat CMET today to reassess creatinine and albumin. Since the patient is now abstinent of cocaine, consideration could be given to titrating carvedilol (keeping in mind HR) versus addition of ACEi/ARB/diuretic with careful attention to renal function.  3. CKD stage IIIb - recheck labs today. He also has a history of renal failure back in 2011 with Cr >8 which he stated was related to being tazed by police. In reviewing further after his visit, his previous UA in 2021 did not show any proteinuria but given his HTN, borderline albumin level, and elevated creatinine, would suggest to initiate referral to nephrology to establish care.  4. Left ventricular hypertrophy - moderate LVH by echo 03/2021. Suspect due to uncontrolled HTN over time. Dr. Katrinka Blazing suspected the same although stated could not exclude a wild type hypertrophic cardiomyopathy. cMRI was suggested at some point but he has baseline renal dysfunction. He has no high risk features of syncope or fam hx SCD. I will reach out to Dr. Katrinka Blazing to discuss whether clinical surveillance with HTN control is appropriate to start.  5. Lung nodule - discussed finding with patient. He is aware. He was advised to discuss follow-up study with primary care (recommended for December 2022 per CT report)  Disposition: F/u with me in 3 months for blood pressure follow-up (anticipate titration of medicines in the interim) and follow-up with Dr. Katrinka Blazing in 1 year. Also encouraged primary care follow-up as well.  Medication Adjustments/Labs and Tests Ordered: Current medicines are reviewed at length with the patient today.  Concerns regarding medicines are outlined above. Medication changes, Labs and Tests ordered today are summarized above and listed in the Patient Instructions accessible in Encounters.   Signed, Laurann Montana, PA-C  05/08/2021 11:53 AM    [facility]  [facility] [address] [place], [place], [place]  [address] Phone: [phone]; Fax: [phone]

## 2021-05-08 ENCOUNTER — Ambulatory Visit (INDEPENDENT_AMBULATORY_CARE_PROVIDER_SITE_OTHER): Payer: Self-pay | Admitting: Physician Assistant

## 2021-05-08 ENCOUNTER — Other Ambulatory Visit: Payer: Self-pay

## 2021-05-08 ENCOUNTER — Encounter: Payer: Self-pay | Admitting: Physician Assistant

## 2021-05-08 VITALS — BP 154/94 | HR 63 | Ht 64.0 in | Wt 223.0 lb

## 2021-05-08 DIAGNOSIS — I248 Other forms of acute ischemic heart disease: Secondary | ICD-10-CM

## 2021-05-08 DIAGNOSIS — I517 Cardiomegaly: Secondary | ICD-10-CM

## 2021-05-08 DIAGNOSIS — N1832 Chronic kidney disease, stage 3b: Secondary | ICD-10-CM

## 2021-05-08 DIAGNOSIS — I10 Essential (primary) hypertension: Secondary | ICD-10-CM

## 2021-05-08 DIAGNOSIS — R911 Solitary pulmonary nodule: Secondary | ICD-10-CM

## 2021-05-08 LAB — COMPREHENSIVE METABOLIC PANEL
ALT: 12 IU/L (ref 0–44)
AST: 19 IU/L (ref 0–40)
Albumin/Globulin Ratio: 1.7 (ref 1.2–2.2)
Albumin: 4 g/dL (ref 4.0–5.0)
Alkaline Phosphatase: 55 IU/L (ref 44–121)
BUN/Creatinine Ratio: 10 (ref 9–20)
BUN: 15 mg/dL (ref 6–24)
Bilirubin Total: 0.3 mg/dL (ref 0.0–1.2)
CO2: 22 mmol/L (ref 20–29)
Calcium: 8.8 mg/dL (ref 8.7–10.2)
Chloride: 106 mmol/L (ref 96–106)
Creatinine, Ser: 1.51 mg/dL — ABNORMAL HIGH (ref 0.76–1.27)
Globulin, Total: 2.4 g/dL (ref 1.5–4.5)
Glucose: 88 mg/dL (ref 65–99)
Potassium: 4 mmol/L (ref 3.5–5.2)
Sodium: 140 mmol/L (ref 134–144)
Total Protein: 6.4 g/dL (ref 6.0–8.5)
eGFR: 58 mL/min/{1.73_m2} — ABNORMAL LOW (ref >59)

## 2021-05-08 LAB — TSH: TSH: 0.526 u[IU]/mL (ref 0.450–4.500)

## 2021-05-08 NOTE — Patient Instructions (Addendum)
Medication Instructions:  Your physician recommends that you continue on your current medications as directed. Please refer to the Current Medication list given to you today.  *If you need a refill on your cardiac medications before your next appointment, please call your pharmacy*   Lab Work: TODAY:  BMET & CBC  If you have labs (blood work) drawn today and your tests are completely normal, you will receive your results only by: Marland Kitchen MyChart Message (if you have MyChart) OR . A paper copy in the mail If you have any lab test that is abnormal or we need to change your treatment, we will call you to review the results.   Testing/Procedures: None ordered   Follow-Up: At [facility], you and your health needs are our priority.  As part of our continuing mission to provide you with exceptional heart care, we have created designated Provider Care Teams.  These Care Teams include your primary Cardiologist (physician) and Advanced Practice Providers (APPs -  Physician Assistants and Nurse Practitioners) who all work together to provide you with the care you need, when you need it.  We recommend signing up for the patient portal called "MyChart".  Sign up information is provided on this After Visit Summary.  MyChart is used to connect with patients for Virtual Visits (Telemedicine).  Patients are able to view lab/test results, encounter notes, upcoming appointments, etc.  Non-urgent messages can be sent to your provider as well.   To learn more about what you can do with MyChart, go to [email or web address].    Your next appointment:   12 month(s)  The format for your next appointment:   In Person  Provider:   You may see Lesleigh Noe, MD or one of the following Advanced Practice Providers on your designated Care Team:    Georgie Chard, NP  Your physician recommends that you schedule a follow-up appointment in: 3 MONTHS WITH DAYNA DUNN, PA-C FOR BLOOD PRESSURE CHECK    Other  Instructions  Call your primary care dr and make an appointment    I would recommend using a blood pressure cuff that goes on your arm. The wrist ones can be inaccurate. If possible, try to select one that also reports your heart rate. To check your blood pressure, choose a time at least 3 hours after taking your blood pressure medicines. If you can sample it at different times of the day, that's great - it might give you more information about how your blood pressure fluctuates. Remain seated in a chair for 5 minutes quietly beforehand, then check it. Please record a list of those readings and call us/send in MyChart message with them for our review after 3-4 days of readings.

## 2021-05-09 ENCOUNTER — Other Ambulatory Visit: Payer: Self-pay

## 2021-05-09 ENCOUNTER — Other Ambulatory Visit: Payer: Self-pay | Admitting: Internal Medicine

## 2021-05-09 ENCOUNTER — Ambulatory Visit: Payer: Self-pay | Admitting: Physician Assistant

## 2021-05-09 VITALS — BP 179/119 | HR 77 | Temp 98.2°F | Resp 18 | Ht 64.0 in | Wt 220.0 lb

## 2021-05-09 DIAGNOSIS — N183 Chronic kidney disease, stage 3 unspecified: Secondary | ICD-10-CM

## 2021-05-09 DIAGNOSIS — Z716 Tobacco abuse counseling: Secondary | ICD-10-CM

## 2021-05-09 DIAGNOSIS — I10 Essential (primary) hypertension: Secondary | ICD-10-CM

## 2021-05-09 DIAGNOSIS — G4700 Insomnia, unspecified: Secondary | ICD-10-CM

## 2021-05-09 DIAGNOSIS — I214 Non-ST elevation (NSTEMI) myocardial infarction: Secondary | ICD-10-CM

## 2021-05-09 DIAGNOSIS — F439 Reaction to severe stress, unspecified: Secondary | ICD-10-CM

## 2021-05-09 DIAGNOSIS — F17200 Nicotine dependence, unspecified, uncomplicated: Secondary | ICD-10-CM

## 2021-05-09 DIAGNOSIS — R911 Solitary pulmonary nodule: Secondary | ICD-10-CM

## 2021-05-09 MED ORDER — CARVEDILOL 3.125 MG PO TABS
3.1250 mg | ORAL_TABLET | Freq: Two times a day (BID) | ORAL | 2 refills | Status: AC
  Filled 2021-05-09: qty 60, 30d supply, fill #0

## 2021-05-09 MED ORDER — ISOSORBIDE MONONITRATE ER 60 MG PO TB24
60.0000 mg | ORAL_TABLET | Freq: Every day | ORAL | 2 refills | Status: AC
Start: 2021-05-09 — End: ?
  Filled 2021-05-09: qty 30, 30d supply, fill #0

## 2021-05-09 MED ORDER — ROSUVASTATIN CALCIUM 20 MG PO TABS
20.0000 mg | ORAL_TABLET | Freq: Every day | ORAL | 2 refills | Status: AC
Start: 2021-05-09 — End: ?
  Filled 2021-05-09: qty 30, 30d supply, fill #0

## 2021-05-09 MED ORDER — AMLODIPINE BESYLATE 10 MG PO TABS
10.0000 mg | ORAL_TABLET | Freq: Every day | ORAL | 2 refills | Status: AC
Start: 2021-05-09 — End: ?
  Filled 2021-05-09: qty 30, 30d supply, fill #0

## 2021-05-09 MED ORDER — ASPIRIN 81 MG PO TBEC
81.0000 mg | DELAYED_RELEASE_TABLET | Freq: Every day | ORAL | 2 refills | Status: AC
Start: 2021-05-09 — End: ?
  Filled 2021-05-09: qty 30, 30d supply, fill #0

## 2021-05-09 NOTE — Progress Notes (Signed)
Established Patient Office Visit  Subjective:  Patient ID: Anthony Estrada, male    DOB: 07-01-1976  Age: 45 y.o. MRN: [id number]  CC:  Chief Complaint  Patient presents with   Medication Refill    HPI Anthony Estrada reports that he was hospitalized from April 29 through Apr 01, 2021.  Hospital course   PCP Please obtain BMP/CBC, 2 view CXR in 1week,  (see Discharge instructions)   PCP Please follow up on the following pending results: Needs outpt follow up with Pulmonary and Cards     Home Health: None   Equipment/Devices: None  Consultations: Cards Discharge Condition: Stable    CODE STATUS: Full    Diet Recommendation: Heart Healthy       Diet Order                      Diet Heart Room service appropriate? Yes; Fluid consistency: Thin  Diet effective now              Diet - low sodium heart healthy                         Chief Complaint  Patient presents with   Shortness of Breath      Brief history of present illness from the day of admission and additional interim summary     Patient in bed, appears comfortable, denies any headache, no fever, no chest pain or pressure, no shortness of breath , no abdominal pain. No new focal weakness.                                                                  Hospital Course      NSTEMI  -  Likely due to cocaine abuse, Managed by Cards, L.Heart Cath with non occlusive CAD, continue medical management, placed on B Blocker, Statin, ASA & Imdur follow with PCP and Cards, now symptom free.   2. Cocaine abuse.  Counseled to quit.   3. HTN - placed on Norvasc, Coreg by Cards added Imdur for better control, stable.   4. AKI on CKD 3B -baseline creatinine around 1.7, AKI resolved, needed gentle hydration, back to baseline.   5. Left upper lobe lung nodule.  Outpatient pulmonary follow-up with     Discharge diagnosis       Principal Problem:   NSTEMI (non-ST elevated myocardial infarction) [facility]) Active  Problems:   Stage 3 chronic kidney disease (HCC)   Chest pain   Elevated troponin   Today's visit Girlfriend is present on the phone and patient is agreeable to share medical information with her.  Reports that he has been out of his medications for the past 2 days.  Reports that he was seen by cardiology yesterday, plan from that visit:  1. Demand ischemia - likely incited by cocaine/vasospasm and hypertension. Cath without significant CAD. Continue ASA, BB, Imdur, amlodipine, statin. He is aware of need to avoid tobacco - smokes cigars. Would suggest follow-up with primary care for lipid management as he otherwise does not have atherosclerotic disease.   2. Essential HTN - Suboptimal blood pressure control noted today. We need a better idea of what it's running at home. It is  also not clear if he is taking his amlodipine. He plans to review his medicines when he gets home. His BP was running 130s-140s when he was on this regimen in the hospital. We discussed use of a pillbox but he does not think this would work well for him. The patient was provided instructions on monitoring blood pressure at home for 3-4 days and relaying results to our office - he plans to obtain a cuff. Will also check TSH and repeat CMET today to reassess creatinine and albumin. Since the patient is now abstinent of cocaine, consideration could be given to titrating carvedilol (keeping in mind HR) versus addition of ACEi/ARB/diuretic with careful attention to renal function.   3. CKD stage IIIb - recheck labs today. He also has a history of renal failure back in 2011 with Cr >8 which he stated was related to being tazed by police. In reviewing further after his visit, his previous UA in 2021 did not show any proteinuria but given his HTN, borderline albumin level, and elevated creatinine, would suggest to initiate referral to nephrology to establish care.   4. Left ventricular hypertrophy - moderate LVH by echo 03/2021.  Suspect due to uncontrolled HTN over time. Dr. Katrinka Blazing suspected the same although stated could not exclude a wild type hypertrophic cardiomyopathy. cMRI was suggested at some point but he has baseline renal dysfunction. He has no high risk features of syncope or fam hx SCD. I will reach out to Dr. Katrinka Blazing to discuss whether clinical surveillance with HTN control is appropriate to start.   5. Lung nodule - discussed finding with patient. He is aware. He was advised to discuss follow-up study with primary care (recommended for December 2022 per CT report)   Disposition: F/u with me in 3 months for blood pressure follow-up (anticipate titration of medicines in the interim) and follow-up with Dr. Katrinka Blazing in 1 year. Also encouraged primary care follow-up as well.   Medication Adjustments/Labs and Tests Ordered: Current medicines are reviewed at length with the patient today.  Concerns regarding medicines are outlined above. Medication changes, Labs and Tests ordered today are summarized above and listed in the Patient Instructions accessible in Encounters  Reports today that he has not been checking his blood pressure at home.  Denies any hypertensive symptoms.  Reports that he is still smoking, states that he continues to smoke to help control his stress.  Reports that he has difficulty with sleeping, states that he does wake up several times throughout the night.  Does admit that he wakes to get up and have a snack.  States that he finds it comforting.  Has not tried anything for relief of insomnia.  Past Medical History:  Diagnosis Date   Chronic kidney disease, stage 3b (HCC)    Cocaine abuse (HCC)    Demand ischemia (HCC)    Hypertension    LVH (left ventricular hypertrophy)    Pulmonary nodule     Past Surgical History:  Procedure Laterality Date   LEFT HEART CATH AND CORONARY ANGIOGRAPHY N/A 04/01/2021   Procedure: LEFT HEART CATH AND CORONARY ANGIOGRAPHY;  Surgeon: Corky Crafts, MD;   Location: [facility] INVASIVE CV LAB;  Service: Cardiovascular;  Laterality: N/A;   no past surgery      Family History  Problem Relation Age of Onset   Hypertension Mother    Diabetes Mother    Hypertension Father    Diabetes Father     Social History   Socioeconomic History  Marital status: Single    Spouse name: Not on file   Number of children: Not on file   Years of education: Not on file   Highest education level: Not on file  Occupational History   Not on file  Tobacco Use   Smoking status: Every Day    Packs/day: 1.00    Pack years: 0.00    Types: Cigars, Cigarettes   Smokeless tobacco: Never  Vaping Use   Vaping Use: Never used  Substance and Sexual Activity   Alcohol use: Yes    Comment: occasionally   Drug use: Yes    Types: Cocaine    Comment: two days ago,  uses occasionally   Sexual activity: Yes  Other Topics Concern   Not on file  Social History Narrative   Not on file   Social Determinants of Health   Financial Resource Strain: Not on file  Food Insecurity: Not on file  Transportation Needs: Not on file  Physical Activity: Not on file  Stress: Not on file  Social Connections: Not on file  Intimate Partner Violence: Not on file    Outpatient Medications Prior to Visit  Medication Sig Dispense Refill   nitroGLYCERIN (NITROSTAT) 0.4 MG SL tablet Place 1 tablet (0.4 mg total) under the tongue every 5 (five) minutes as needed for chest pain. 25 tablet 0   amLODipine (NORVASC) 10 MG tablet Take 1 tablet (10 mg total) by mouth daily. 30 tablet 0   aspirin 81 MG EC tablet Take 1 tablet (81 mg total) by mouth daily. Swallow whole. 30 tablet 0   carvedilol (COREG) 3.125 MG tablet Take 1 tablet (3.125 mg total) by mouth 2 (two) times daily with a meal. 60 tablet 0   isosorbide mononitrate (IMDUR) 60 MG 24 hr tablet Take 1 tablet (60 mg total) by mouth daily. 30 tablet 0   rosuvastatin (CRESTOR) 20 MG tablet Take 1 tablet (20 mg total) by mouth daily. 30  tablet 0   No facility-administered medications prior to visit.    Allergies  Allergen Reactions   Codeine Itching   Shellfish Allergy Swelling    ROS Review of Systems  Constitutional:  Negative for chills and fever.  HENT: Negative.    Eyes: Negative.   Respiratory:  Negative for cough and shortness of breath.   Cardiovascular:  Negative for chest pain.  Gastrointestinal: Negative.   Endocrine: Negative.   Genitourinary: Negative.   Musculoskeletal: Negative.   Skin: Negative.   Allergic/Immunologic: Negative.   Neurological: Negative.   Hematological: Negative.   Psychiatric/Behavioral:  Positive for sleep disturbance. Negative for dysphoric mood, self-injury and suicidal ideas.      Objective:    Physical Exam Vitals and nursing note reviewed.  Constitutional:      Appearance: Normal appearance.  HENT:     Head: Normocephalic and atraumatic.     Right Ear: External ear normal.     Left Ear: External ear normal.     Nose: Nose normal.     Mouth/Throat:     Mouth: Mucous membranes are moist.     Pharynx: Oropharynx is clear.  Eyes:     Extraocular Movements: Extraocular movements intact.     Conjunctiva/sclera: Conjunctivae normal.     Pupils: Pupils are equal, round, and reactive to light.  Cardiovascular:     Rate and Rhythm: Normal rate and regular rhythm.     Pulses: Normal pulses.     Heart sounds: Normal heart sounds.  Pulmonary:  Effort: Pulmonary effort is normal.     Breath sounds: Normal breath sounds.  Musculoskeletal:        General: Normal range of motion.     Cervical back: Normal range of motion and neck supple.  Skin:    General: Skin is warm and dry.  Neurological:     General: No focal deficit present.     Mental Status: He is alert and oriented to person, place, and time.  Psychiatric:        Mood and Affect: Mood normal.        Behavior: Behavior normal.        Thought Content: Thought content normal.        Judgment: Judgment  normal.    BP (!) 179/119 (BP Location: Left Arm, Patient Position: Sitting, Cuff Size: Large)   Pulse 77   Temp 98.2 F (36.8 C) (Oral)   Resp 18   Ht 5\' 4"  (1.626 m)   Wt 220 lb (99.8 kg)   SpO2 100%   BMI 37.76 kg/m  Wt Readings from Last 3 Encounters:  05/09/21 220 lb (99.8 kg)  05/08/21 223 lb (101.2 kg)  03/30/21 216 lb 1.6 oz (98 kg)     Health Maintenance Due  Topic Date Due   Pneumococcal Vaccine 54-45 Years old (1 - PCV) Never done   TETANUS/TDAP  Never done   COLONOSCOPY (Pts 45-67yrs Insurance coverage will need to be confirmed)  Never done    There are no preventive care reminders to display for this patient.  Lab Results  Component Value Date   TSH 0.526 05/08/2021   Lab Results  Component Value Date   WBC 4.9 04/01/2021   HGB 12.7 (L) 04/01/2021   HCT 38.0 (L) 04/01/2021   MCV 93.4 04/01/2021   PLT 168 04/01/2021   Lab Results  Component Value Date   NA 140 05/08/2021   K 4.0 05/08/2021   CO2 22 05/08/2021   GLUCOSE 88 05/08/2021   BUN 15 05/08/2021   CREATININE 1.51 (H) 05/08/2021   BILITOT 0.3 05/08/2021   ALKPHOS 55 05/08/2021   AST 19 05/08/2021   ALT 12 05/08/2021   PROT 6.4 05/08/2021   ALBUMIN 4.0 05/08/2021   CALCIUM 8.8 05/08/2021   ANIONGAP 7 04/01/2021   EGFR 58 (L) 05/08/2021   Lab Results  Component Value Date   CHOL 145 03/30/2021   Lab Results  Component Value Date   HDL 46 03/30/2021   Lab Results  Component Value Date   LDLCALC 87 03/30/2021   Lab Results  Component Value Date   TRIG 62 03/30/2021   Lab Results  Component Value Date   CHOLHDL 3.2 03/30/2021   Lab Results  Component Value Date   HGBA1C 5.3 09/13/2020      Assessment & Plan:   Problem List Items Addressed This Visit       Cardiovascular and Mediastinum   Essential hypertension   Relevant Medications   amLODipine (NORVASC) 10 MG tablet   aspirin 81 MG EC tablet   carvedilol (COREG) 3.125 MG tablet   rosuvastatin (CRESTOR) 20 MG  tablet   isosorbide mononitrate (IMDUR) 60 MG 24 hr tablet   NSTEMI (non-ST elevated myocardial infarction) (HCC)   Relevant Medications   amLODipine (NORVASC) 10 MG tablet   aspirin 81 MG EC tablet   carvedilol (COREG) 3.125 MG tablet   rosuvastatin (CRESTOR) 20 MG tablet   isosorbide mononitrate (IMDUR) 60 MG 24 hr tablet  Genitourinary   Stage 3 chronic kidney disease (HCC) (Chronic)     Other   Solitary pulmonary nodule   Tobacco use disorder   Stress   Relevant Orders   Ambulatory referral to Social Work   Insomnia - Primary   Other Visit Diagnoses     Encounter for smoking cessation counseling           Meds ordered this encounter  Medications   amLODipine (NORVASC) 10 MG tablet    Sig: Take 1 tablet (10 mg total) by mouth daily.    Dispense:  30 tablet    Refill:  2    Order Specific Question:   Supervising Provider    Answer:   Scherry Ran   aspirin 81 MG EC tablet    Sig: Take 1 tablet (81 mg total) by mouth daily. Swallow whole.    Dispense:  30 tablet    Refill:  2    Order Specific Question:   Supervising Provider    Answer:   Shan Levans E [[id number]]   carvedilol (COREG) 3.125 MG tablet    Sig: Take 1 tablet (3.125 mg total) by mouth 2 (two) times daily with a meal.    Dispense:  60 tablet    Refill:  2    Order Specific Question:   Supervising Provider    Answer:   Shan Levans E [[id number]]   rosuvastatin (CRESTOR) 20 MG tablet    Sig: Take 1 tablet (20 mg total) by mouth daily.    Dispense:  30 tablet    Refill:  2    Order Specific Question:   Supervising Provider    Answer:   Storm Frisk [[id number]]   isosorbide mononitrate (IMDUR) 60 MG 24 hr tablet    Sig: Take 1 tablet (60 mg total) by mouth daily.    Dispense:  30 tablet    Refill:  2    Order Specific Question:   Supervising Provider    Answer:   WRIGHT, PATRICK E [[id number]]  1. Essential hypertension Continue current regimen.  Patient encouraged to check blood pressure at  home, keep a written log and have available for all office visits.  Red flags given for prompt reevaluation.  Continue follow-up with cardiology. - amLODipine (NORVASC) 10 MG tablet; Take 1 tablet (10 mg total) by mouth daily.  Dispense: 30 tablet; Refill: 2 - carvedilol (COREG) 3.125 MG tablet; Take 1 tablet (3.125 mg total) by mouth 2 (two) times daily with a meal.  Dispense: 60 tablet; Refill: 2 - isosorbide mononitrate (IMDUR) 60 MG 24 hr tablet; Take 1 tablet (60 mg total) by mouth daily.  Dispense: 30 tablet; Refill: 2  2. Insomnia, unspecified type Trial melatonin over-the-counter.  Patient education given on good sleep hygiene.  3. NSTEMI (non-ST elevated myocardial infarction) (HCC)  - aspirin 81 MG EC tablet; Take 1 tablet (81 mg total) by mouth daily. Swallow whole.  Dispense: 30 tablet; Refill: 2 - rosuvastatin (CRESTOR) 20 MG tablet; Take 1 tablet (20 mg total) by mouth daily.  Dispense: 30 tablet; Refill: 2  4. Stage 3 chronic kidney disease, unspecified whether stage 3a or 3b CKD [facility]) Cardiology has started referral for patient to nephrology for further evaluation  5. Stress Refer for CBT - Ambulatory referral to Social Work  6. Tobacco use disorder Extensive smoking cessation advice given  7. Solitary pulmonary nodule Patient is due for follow-up imaging December 2022  8. Encounter for smoking cessation counseling  I have reviewed the patient's medical history (PMH, PSH, Social History, Family History, Medications, and allergies) , and have been updated if relevant. I spent 37 minutes reviewing chart and  face to face time with patient.     Follow-up: Return if symptoms worsen or fail to improve.    Loraine Grip Mayers, PA-C

## 2021-05-09 NOTE — Progress Notes (Signed)
Patient presents for refills of medications. Patient has not eaten today and not taken  medication in 2 days.. Patient denies pain at this time.

## 2021-05-09 NOTE — Patient Instructions (Signed)
I sent your medication refills to your pharmacy.  I strongly encourage you to obtain a blood pressure cuff, check your blood pressure at home, keep a written log and have available for all office visits.  I strongly encourage you to work on smoking cessation.  I encourage you to start using melatonin at bedtime, discontinue waking up to eat in the middle the night.  I started a referral for you to speak with our counselor.  Please let us know if there is anything else we can do for you.  Roney Jaffe, PA-C Physician Assistant [facility] [email or web address]   Insomnia Insomnia is a sleep disorder that makes it difficult to fall asleep or stay asleep. Insomnia can cause fatigue, low energy, difficulty concentrating, mood swings, and poor performance at work or school. There are three different ways to classify insomnia: Difficulty falling asleep. Difficulty staying asleep. Waking up too early in the morning. Any type of insomnia can be long-term (chronic) or short-term (acute). Both are common. Short-term insomnia usually lasts for three months or less. Chronic insomnia occurs at least three times a week for longer than three months. What are the causes? Insomnia may be caused by another condition, situation, or substance, such as: Anxiety. Certain medicines. Gastroesophageal reflux disease (GERD) or other gastrointestinal conditions. Asthma or other breathing conditions. Restless legs syndrome, sleep apnea, or other sleep disorders. Chronic pain. Menopause. Stroke. Abuse of alcohol, tobacco, or illegal drugs. Mental health conditions, such as depression. Caffeine. Neurological disorders, such as Alzheimer's disease. An overactive thyroid (hyperthyroidism). Sometimes, the cause of insomnia may not be known. What increases the risk? Risk factors for insomnia include: Gender. Women are affected more often than men. Age.  Insomnia is more common as you get older. Stress. Lack of exercise. Irregular work schedule or working night shifts. Traveling between different time zones. Certain medical and mental health conditions. What are the signs or symptoms? If you have insomnia, the main symptom is having trouble falling asleep or having trouble staying asleep. This may lead to other symptoms, such as: Feeling fatigued or having low energy. Feeling nervous about going to sleep. Not feeling rested in the morning. Having trouble concentrating. Feeling irritable, anxious, or depressed. How is this diagnosed? This condition may be diagnosed based on: Your symptoms and medical history. Your health care provider may ask about: Your sleep habits. Any medical conditions you have. Your mental health. A physical exam. How is this treated? Treatment for insomnia depends on the cause. Treatment may focus on treating an underlying condition that is causing insomnia. Treatment may also include: Medicines to help you sleep. Counseling or therapy. Lifestyle adjustments to help you sleep better. Follow these instructions at home: Eating and drinking Limit or avoid alcohol, caffeinated beverages, and cigarettes, especially close to bedtime. These can disrupt your sleep. Do not eat a large meal or eat spicy foods right before bedtime. This can lead to digestive discomfort that can make it hard for you to sleep.   Sleep habits Keep a sleep diary to help you and your health care provider figure out what could be causing your insomnia. Write down: When you sleep. When you wake up during the night. How well you sleep. How rested you feel the next day. Any side effects of medicines you are taking. What you eat and drink. Make your bedroom a dark, comfortable place where it is easy to fall asleep. Put up shades or blackout curtains to block light from outside. Use  a white noise machine to block noise. Keep the temperature  cool. Limit screen use before bedtime. This includes: Watching TV. Using your smartphone, tablet, or computer. Stick to a routine that includes going to bed and waking up at the same times every day and night. This can help you fall asleep faster. Consider making a quiet activity, such as reading, part of your nighttime routine. Try to avoid taking naps during the day so that you sleep better at night. Get out of bed if you are still awake after 15 minutes of trying to sleep. Keep the lights down, but try reading or doing a quiet activity. When you feel sleepy, go back to bed.   General instructions Take over-the-counter and prescription medicines only as told by your health care provider. Exercise regularly, as told by your health care provider. Avoid exercise starting several hours before bedtime. Use relaxation techniques to manage stress. Ask your health care provider to suggest some techniques that may work well for you. These may include: Breathing exercises. Routines to release muscle tension. Visualizing peaceful scenes. Make sure that you drive carefully. Avoid driving if you feel very sleepy. Keep all follow-up visits as told by your health care provider. This is important. Contact a health care provider if: You are tired throughout the day. You have trouble in your daily routine due to sleepiness. You continue to have sleep problems, or your sleep problems get worse. Get help right away if: You have serious thoughts about hurting yourself or someone else. If you ever feel like you may hurt yourself or others, or have thoughts about taking your own life, get help right away. You can go to your nearest emergency department or call: Your local emergency services (911 in the [place] A suicide crisis helpline, such as the National Suicide Prevention Lifeline at 365-445-1210. This is open 24 hours a day. Summary Insomnia is a sleep disorder that makes it difficult to fall asleep or  stay asleep. Insomnia can be long-term (chronic) or short-term (acute). Treatment for insomnia depends on the cause. Treatment may focus on treating an underlying condition that is causing insomnia. Keep a sleep diary to help you and your health care provider figure out what could be causing your insomnia. This information is not intended to replace advice given to you by your health care provider. Make sure you discuss any questions you have with your health care provider. Document Revised: 09/27/2020 Document Reviewed: 09/27/2020 Elsevier Patient Education  2021 Elsevier Inc.  Managing the Challenge of Quitting Smoking Quitting smoking is a physical and mental challenge. You will face cravings, withdrawal symptoms, and temptation. Before quitting, work with your health care provider to make a plan that can help you manage quitting. Preparation can help you quit and keep you from giving in. How to manage lifestyle changes Managing stress Stress can make you want to smoke, and wanting to smoke may cause stress. It is important to find ways to manage your stress. You might try some of the following: Practice relaxation techniques. Breathe slowly and deeply, in through your nose and out through your mouth. Listen to music. Soak in a bath or take a shower. Imagine a peaceful place or vacation. Get some support. Talk with family or friends about your stress. Join a support group. Talk with a counselor or therapist. Get some physical activity. Go for a walk, run, or bike ride. Play a favorite sport. Practice yoga.   Medicines Talk with your health care provider about  medicines that might help you deal with cravings and make quitting easier for you. Relationships Social situations can be difficult when you are quitting smoking. To manage this, you can: Avoid parties and other social situations where people might be smoking. Avoid alcohol. Leave right away if you have the urge to  smoke. Explain to your family and friends that you are quitting smoking. Ask for support and let them know you might be a bit grumpy. Plan activities where smoking is not an option. General instructions Be aware that many people gain weight after they quit smoking. However, not everyone does. To keep from gaining weight, have a plan in place before you quit and stick to the plan after you quit. Your plan should include: Having healthy snacks. When you have a craving, it may help to: Eat popcorn, carrots, celery, or other cut vegetables. Chew sugar-free gum. Changing how you eat. Eat small portion sizes at meals. Eat 4-6 small meals throughout the day instead of 1-2 large meals a day. Be mindful when you eat. Do not watch television or do other things that might distract you as you eat. Exercising regularly. Make time to exercise each day. If you do not have time for a long workout, do short bouts of exercise for 5-10 minutes several times a day. Do some form of strengthening exercise, such as weight lifting. Do some exercise that gets your heart beating and causes you to breathe deeply, such as walking fast, running, swimming, or biking. This is very important. Drinking plenty of water or other low-calorie or no-calorie drinks. Drink 6-8 glasses of water daily.   How to recognize withdrawal symptoms Your body and mind may experience discomfort as you try to get used to not having nicotine in your system. These effects are called withdrawal symptoms. They may include: Feeling hungrier than normal. Having trouble concentrating. Feeling irritable or restless. Having trouble sleeping. Feeling depressed. Craving a cigarette. To manage withdrawal symptoms: Avoid places, people, and activities that trigger your cravings. Remember why you want to quit. Get plenty of sleep. Avoid coffee and other caffeinated drinks. These may worsen some of your symptoms. These symptoms may surprise you. But be  assured that they are normal to have when quitting smoking. How to manage cravings Come up with a plan for how to deal with your cravings. The plan should include the following: A definition of the specific situation you want to deal with. An alternative action you will take. A clear idea for how this action will help. The name of someone who might help you with this. Cravings usually last for 5-10 minutes. Consider taking the following actions to help you with your plan to deal with cravings: Keep your mouth busy. Chew sugar-free gum. Suck on hard candies or a straw. Brush your teeth. Keep your hands and body busy. Change to a different activity right away. Squeeze or play with a ball. Do an activity or a hobby, such as making bead jewelry, practicing needlepoint, or working with wood. Mix up your normal routine. Take a short exercise break. Go for a quick walk or run up and down stairs. Focus on doing something kind or helpful for someone else. Call a friend or family member to talk during a craving. Join a support group. Contact a quitline. Where to find support To get help or find a support group: Call the National Cancer Institute's Smoking Quitline: 1-800-QUIT NOW 778-583-1958(442-086-3657) Visit the website of the Substance Abuse and Mental Health Services  Administration: SkateOasis.com.pt Text QUIT to SmokefreeTXT: O3821152 Where to find more information Visit these websites to find more information on quitting smoking: National Cancer Institute: www.smokefree.gov American Lung Association: www.lung.org American Cancer Society: www.cancer.org Centers for Disease Control and Prevention: FootballExhibition.com.br American Heart Association: www.heart.org Contact a health care provider if: You want to change your plan for quitting. The medicines you are taking are not helping. Your eating feels out of control or you cannot sleep. Get help right away if: You feel depressed or become very  anxious. Summary Quitting smoking is a physical and mental challenge. You will face cravings, withdrawal symptoms, and temptation to smoke again. Preparation can help you as you go through these challenges. Try different techniques to manage stress, handle social situations, and prevent weight gain. You can deal with cravings by keeping your mouth busy (such as by chewing gum), keeping your hands and body busy, calling family or friends, or contacting a quitline for people who want to quit smoking. You can deal with withdrawal symptoms by avoiding places where people smoke, getting plenty of rest, and avoiding drinks with caffeine. This information is not intended to replace advice given to you by your health care provider. Make sure you discuss any questions you have with your health care provider. Document Revised: 09/06/2019 Document Reviewed: 09/06/2019 Elsevier Patient Education  2021 [employer].

## 2021-05-10 ENCOUNTER — Telehealth: Payer: Self-pay | Admitting: Physician Assistant

## 2021-05-10 ENCOUNTER — Telehealth: Payer: Self-pay | Admitting: Interventional Cardiology

## 2021-05-10 NOTE — Telephone Encounter (Signed)
Lm to call back ./cy 

## 2021-05-10 NOTE — Telephone Encounter (Signed)
PT is returning a call about results 

## 2021-05-17 ENCOUNTER — Inpatient Hospital Stay: Payer: Self-pay | Admitting: Internal Medicine

## 2021-05-17 NOTE — Telephone Encounter (Signed)
Left message to call back  

## 2021-05-20 NOTE — Telephone Encounter (Signed)
Returned call to pt and left another message for him to call back.

## 2021-05-22 ENCOUNTER — Encounter: Payer: Self-pay | Admitting: Physician Assistant

## 2021-05-22 NOTE — Progress Notes (Signed)
See last OV. Discussed consideration of MRI vs medical therapy with Dr. Katrinka Blazing (moderate LVH) - he suggests to hold off on MRI. [name] PA-C

## 2021-05-31 DEATH — deceased

## 2021-06-06 ENCOUNTER — Encounter: Payer: Self-pay | Admitting: *Deleted

## 2021-06-06 NOTE — Telephone Encounter (Signed)
Have mailed a letter to the pt to contact the office.

## 2021-07-05 ENCOUNTER — Ambulatory Visit: Payer: Self-pay | Admitting: Internal Medicine

## 2021-07-11 ENCOUNTER — Encounter: Payer: Self-pay | Admitting: Physician Assistant

## 2021-08-07 ENCOUNTER — Encounter: Payer: Self-pay | Admitting: Physician Assistant

## 2021-08-07 NOTE — Progress Notes (Deleted)
Cardiology Office Note    Date:  08/07/2021   ID:  Anthony Estrada, DOB Aug 15, 1976, MRN [id number]  PCP:  Marcine Matar, MD  Cardiologist:  Lesleigh Noe, MD  Electrophysiologist:  None   Chief Complaint: ***  History of Present Illness:   Anthony Estrada is a 45 y.o. male with history of HTN, CKD stage IIIa, cocaine abuse, lung nodule, recent demand ischemia who presents for follow-up. He also has a history of ARF with Cr >8 in the context of being tased by police. He was recently admitted with SOB and sensation of lungs exploding, found to have troponin of 1k. 2D echo 03/30/21 EF 65-70%, moderate LVH, grade 1 DD, moderate LAE. Cardiac cath 04/01/21 with no evidence of CAD, +moderately elevated LVEDP. D-dimer was elvated but CTA showed no PE, + known LUL nodule stable since 11/2019, follow-up suggested through December of 2022 to ensure stability. Elevated troponin was felt due to demand ischemia in context of cocaine use 2 days prior to event. There was some question of potential HCM but in discussing further with Dr. Katrinka Blazing, he did not feel MRI was warranted as the hypertrophy could be explained by uncontrolled HTN. At last OV 05/2021 he was still hypertensive and asked to follow BP at home and call us with readings but this did not occur. He did not return call about his results either. During the visit, I he verbalized understanding of the need to f/u with primary care the lung nodule. I also had referred him to nephrology to establish care.  See last plan  Essential HTN Demand ischemia in context of cocaine use CKD stage IIIa now Left ventricular hypertrophy  Labwork independently reviewed: 05/2021 Cr 1.51, K 4.0, LFTs wnl, TSH wnl 03/2021 Hgb 12.7, BNP 267, K 4.0, Cr 1.72, albumin 3.2, AST/ALT OK, LDL 87, troponin 1014, d-dimer elevated   Past Medical History:  Diagnosis Date   Chronic kidney disease, stage 3b (HCC)    Cocaine abuse (HCC)    Demand ischemia (HCC)     Hypertension    LVH (left ventricular hypertrophy)    Pulmonary nodule     Past Surgical History:  Procedure Laterality Date   LEFT HEART CATH AND CORONARY ANGIOGRAPHY N/A 04/01/2021   Procedure: LEFT HEART CATH AND CORONARY ANGIOGRAPHY;  Surgeon: Corky Crafts, MD;  Location: [facility];  Service: Cardiovascular;  Laterality: N/A;   no past surgery      Current Medications: No outpatient medications have been marked as taking for the 08/09/21 encounter (Appointment) with Laurann Montana, PA-C.   ***   Allergies:   Codeine and Shellfish allergy   Social History   Socioeconomic History   Marital status: Single    Spouse name: Not on file   Number of children: Not on file   Years of education: Not on file   Highest education level: Not on file  Occupational History   Not on file  Tobacco Use   Smoking status: Every Day    Packs/day: 1.00    Types: Cigars, Cigarettes   Smokeless tobacco: Never  Vaping Use   Vaping Use: Never used  Substance and Sexual Activity   Alcohol use: Yes    Comment: occasionally   Drug use: Yes    Types: Cocaine    Comment: two days ago,  uses occasionally   Sexual activity: Yes  Other Topics Concern   Not on file  Social History Narrative  Not on file   Social Determinants of Health   Financial Resource Strain: Not on file  Food Insecurity: Not on file  Transportation Needs: Not on file  Physical Activity: Not on file  Stress: Not on file  Social Connections: Not on file     Family History:  The patient's ***family history includes Diabetes in his father and mother; Hypertension in his father and mother.  ROS:   Please see the history of present illness. Otherwise, review of systems is positive for ***.  All other systems are reviewed and otherwise negative.    EKGs/Labs/Other Studies Reviewed:    Studies reviewed are outlined and summarized above. Reports included below if pertinent.  LHC 04/01/21   LV end  diastolic pressure is moderately elevated. There is no aortic valve stenosis. No angiographically apparent CAD. Tortuous right subclavian artery made torquing catheters more difficult. AL1 needed to cross aortic valve and for engaging the RCA.   Continue preventive therapy.  Stopping cocaine use was discussed after the case. .     2D echo 03/30/21 IMPRESSIONS     1. Left ventricular ejection fraction, by estimation, is 65 to 70%. The  left ventricle has normal function. The left ventricle has no regional  wall motion abnormalities. There is moderate left ventricular hypertrophy.  Left ventricular diastolic  parameters are consistent with Grade I diastolic dysfunction (impaired  relaxation). Elevated left ventricular end-diastolic pressure. The E/e' is  15.   2. Right ventricular systolic function is normal. The right ventricular  size is normal. Tricuspid regurgitation signal is inadequate for assessing  PA pressure.   3. Left atrial size was moderately dilated.   4. The mitral valve is grossly normal. Trivial mitral valve  regurgitation.   5. The aortic valve is tricuspid. Aortic valve regurgitation is trivial.  Mild aortic valve sclerosis is present, with no evidence of aortic valve  stenosis.   6. The inferior vena cava is normal in size with <50% respiratory  variability, suggesting right atrial pressure of 8 mmHg.   Comparison(s): No prior Echocardiogram.     EKG:  EKG is ordered today, personally reviewed, demonstrating ***  Recent Labs: 04/01/2021: B Natriuretic Peptide 267.0; Hemoglobin 12.7; Magnesium 2.1; Platelets 168 05/08/2021: ALT 12; BUN 15; Creatinine, Ser 1.51; Potassium 4.0; Sodium 140; TSH 0.526  Recent Lipid Panel    Component Value Date/Time   CHOL 145 03/30/2021 0434   CHOL 154 09/13/2020 0928   TRIG 62 03/30/2021 0434   HDL 46 03/30/2021 0434   HDL 58 09/13/2020 0928   CHOLHDL 3.2 03/30/2021 0434   VLDL 12 03/30/2021 0434   LDLCALC 87 03/30/2021 0434    LDLCALC 84 09/13/2020 0928    PHYSICAL EXAM:    VS:  There were no vitals taken for this visit.  BMI: There is no height or weight on file to calculate BMI.  GEN: Well nourished, well developed male in no acute distress HEENT: normocephalic, atraumatic Neck: no JVD, carotid bruits, or masses Cardiac: ***RRR; no murmurs, rubs, or gallops, no edema  Respiratory:  clear to auscultation bilaterally, normal work of breathing GI: soft, nontender, nondistended, + BS MS: no deformity or atrophy Skin: warm and dry, no rash Neuro:  Alert and Oriented x 3, Strength and sensation are intact, follows commands Psych: euthymic mood, full affect  Wt Readings from Last 3 Encounters:  05/09/21 220 lb (99.8 kg)  05/08/21 223 lb (101.2 kg)  03/30/21 216 lb 1.6 oz (98 kg)  ASSESSMENT & PLAN:   ***     Disposition: F/u with ***   Medication Adjustments/Labs and Tests Ordered: Current medicines are reviewed at length with the patient today.  Concerns regarding medicines are outlined above. Medication changes, Labs and Tests ordered today are summarized above and listed in the Patient Instructions accessible in Encounters.   Signed, Laurann Montana, PA-C  08/07/2021 9:57 AM    [facility] [address] [place], [place], [place]  [address] Phone: [phone]; Fax: [phone]

## 2021-08-09 ENCOUNTER — Ambulatory Visit: Payer: Self-pay | Admitting: Physician Assistant

## 2021-08-09 DIAGNOSIS — N1831 Chronic kidney disease, stage 3a: Secondary | ICD-10-CM

## 2021-08-09 DIAGNOSIS — I517 Cardiomegaly: Secondary | ICD-10-CM

## 2021-08-09 DIAGNOSIS — I10 Essential (primary) hypertension: Secondary | ICD-10-CM

## 2021-08-09 DIAGNOSIS — I248 Other forms of acute ischemic heart disease: Secondary | ICD-10-CM

## 2022-09-15 IMAGING — CT CT ANGIO CHEST
2 of 8 series · 19 of 36 positions shown · IV contrast (Omnipaque)
Comparison: Chest radiography same day.  CT 10/29/2020.

CLINICAL DATA: Shortness of breath

EXAM:
CT ANGIOGRAPHY CHEST WITH CONTRAST
TECHNIQUE: Multidetector CT imaging of the chest was performed using the
standard protocol during bolus administration of intravenous
contrast. Multiplanar CT image reconstructions and MIPs were
obtained to evaluate the vascular anatomy.
CONTRAST:  100mL OMNIPAQUE IOHEXOL 350 MG/ML SOLN

[Series 6: pe thins · axial · 0.73mm/px · z∈[-157,+107]mm · 18 of 296 slices shown]
[im 16/296  lung]
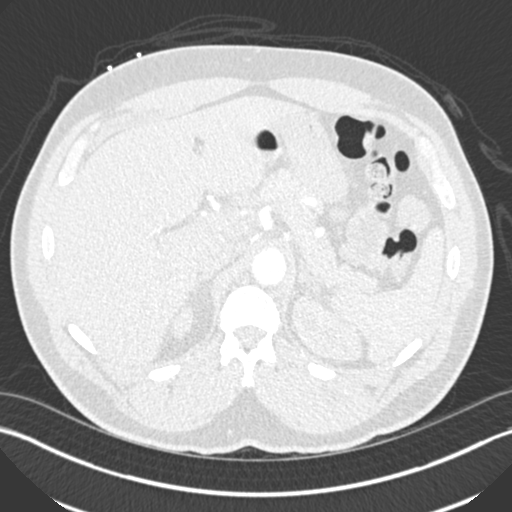
[im 32/296  mediastinal]
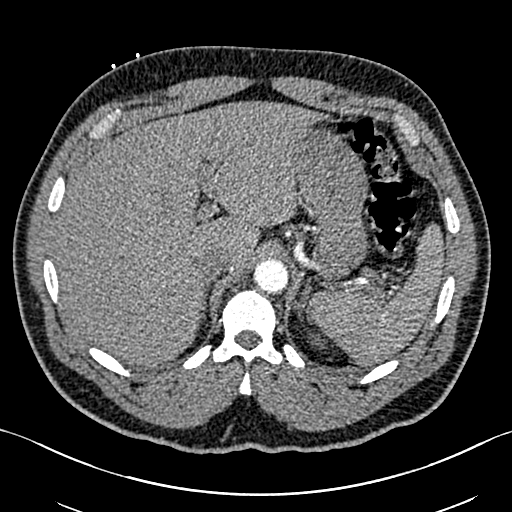
[im 47/296  lung]
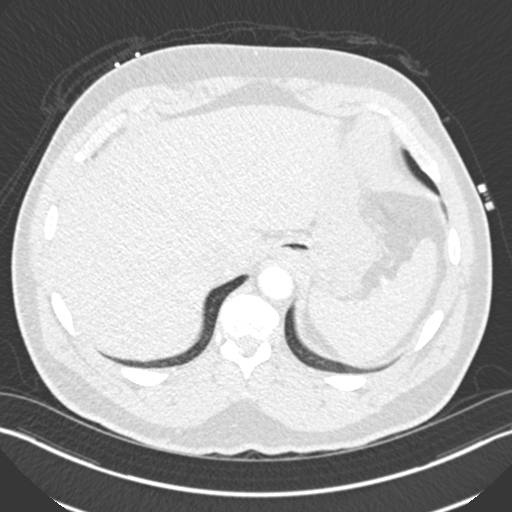
[im 63/296  mediastinal]
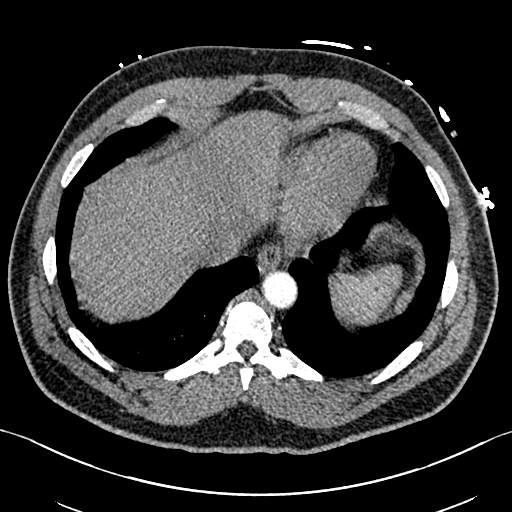
[im 78/296  lung]
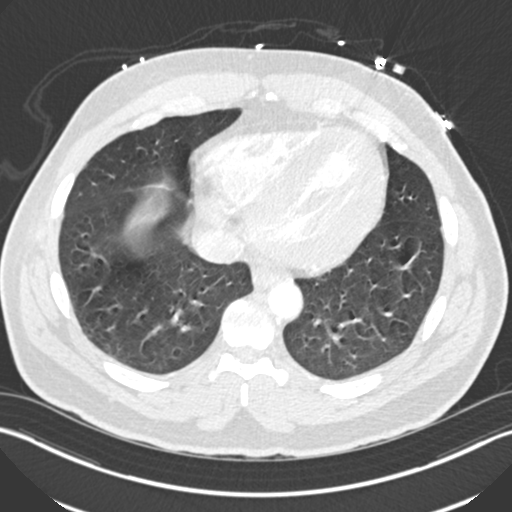
[im 94/296  mediastinal]
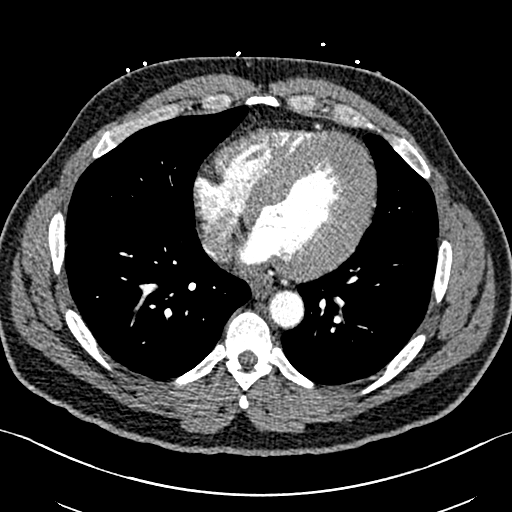
[im 109/296  lung]
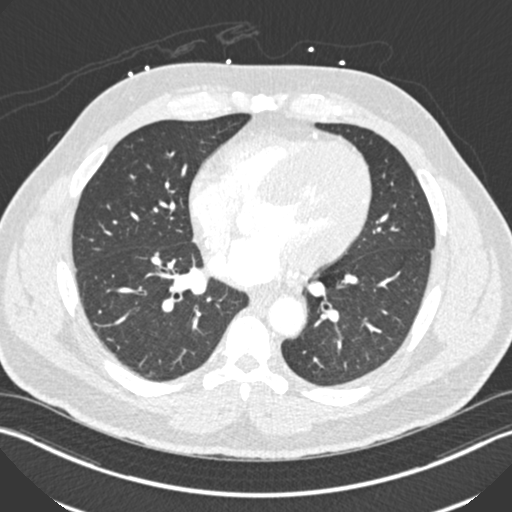
[im 125/296  mediastinal]
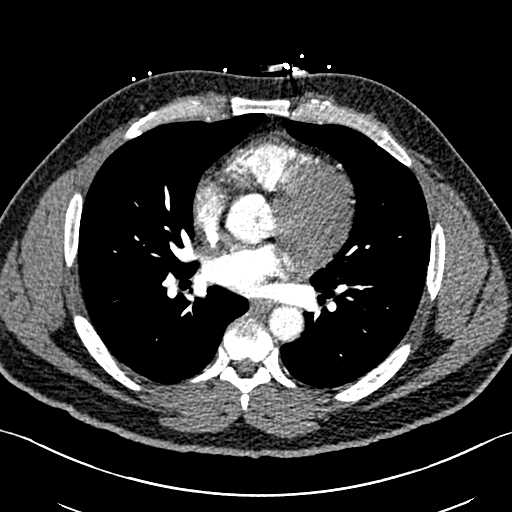
[im 140/296  lung]
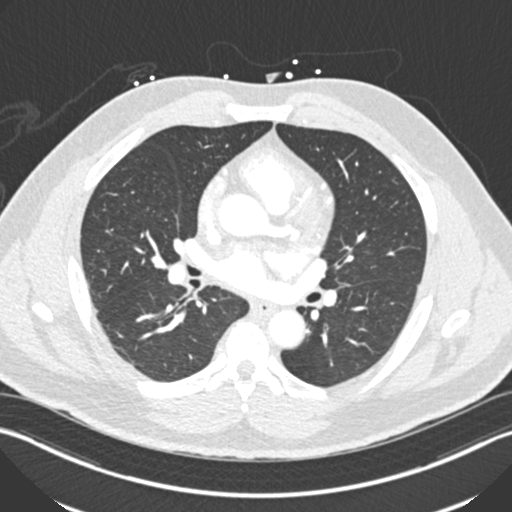
[im 156/296  mediastinal]
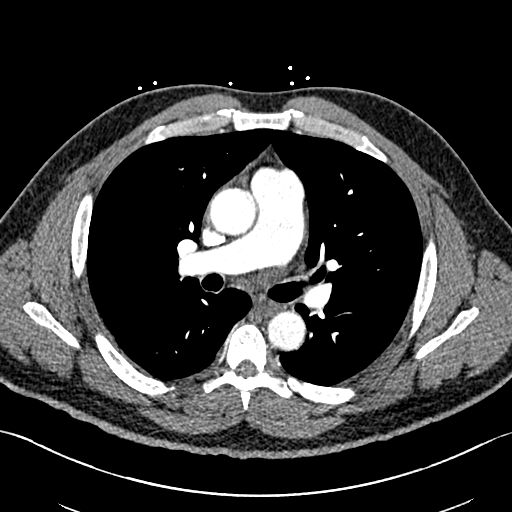
[im 171/296  lung]
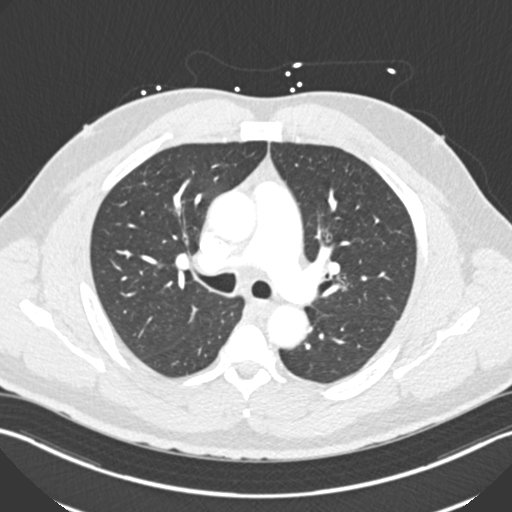
[im 187/296  mediastinal]
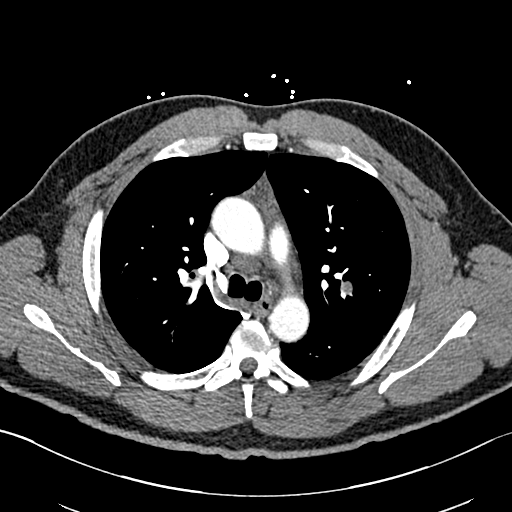
[im 202/296  lung]
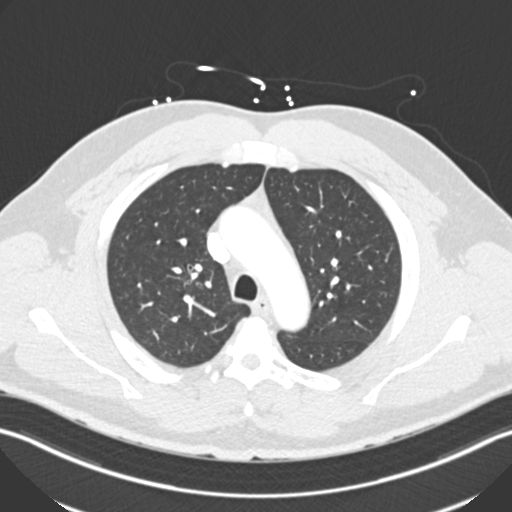
[im 218/296  mediastinal]
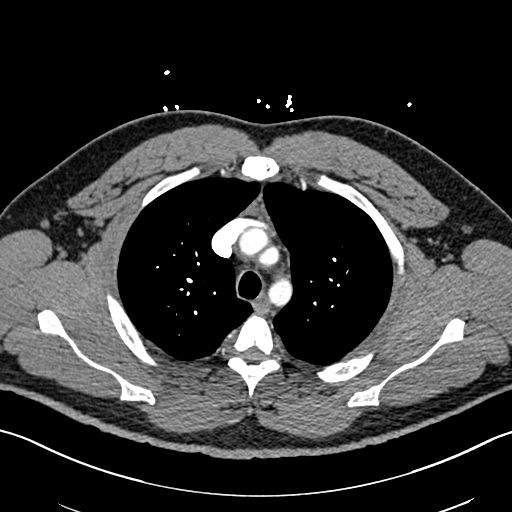
[im 233/296  lung]
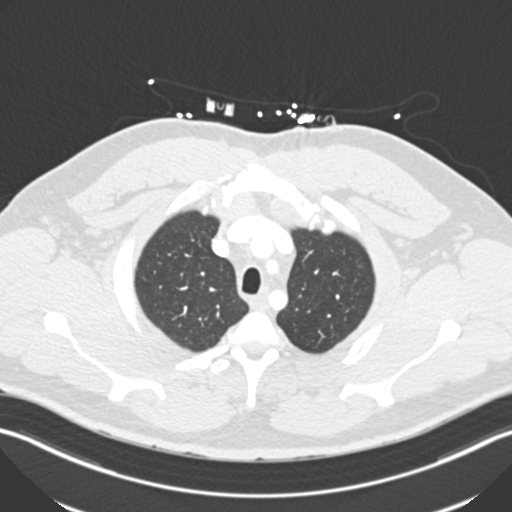
[im 249/296  mediastinal]
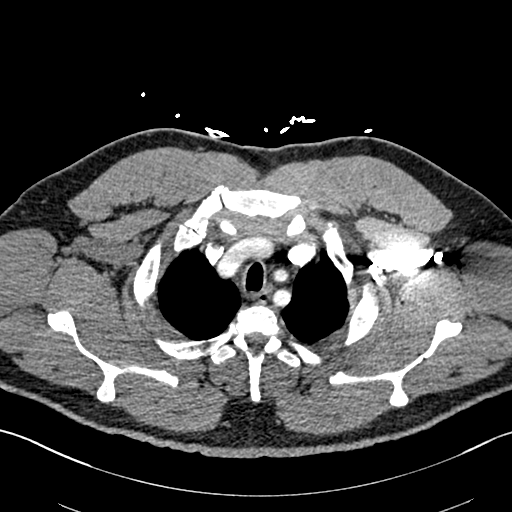
[im 264/296  lung]
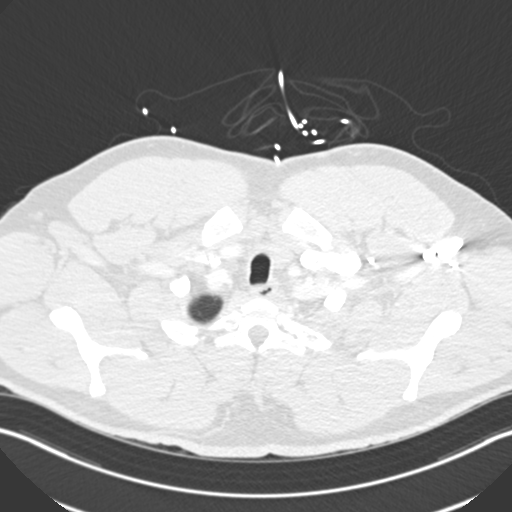
[im 280/296  mediastinal]
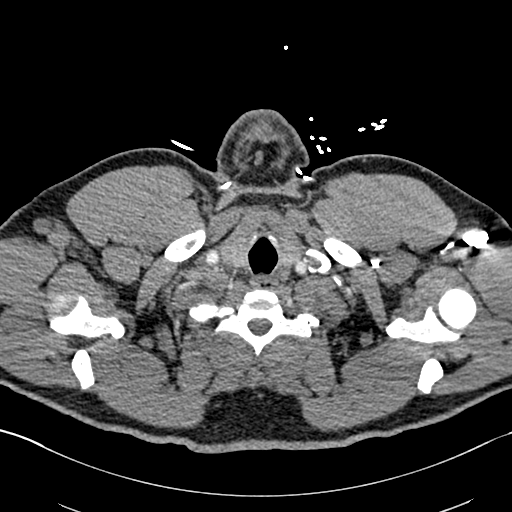

[Series 7: pe coronal mpr · coronal · 0.63mm/px · 1 of 162 slices shown]
[im 81/162  mediastinal]
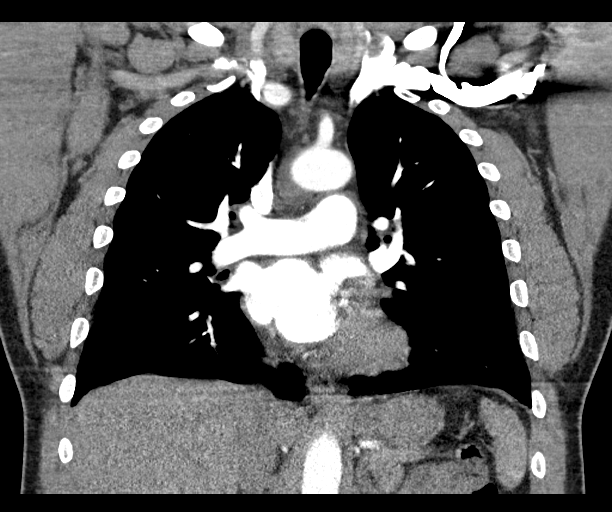

[19 of 36 positions shown; findings below may reference images not displayed]

FINDINGS: Cardiovascular: Heart size is normal. No coronary artery
calcification. Very minimal thoracic aortic atherosclerotic
calcification. Pulmonary arterial opacification is good. There are
no pulmonary emboli.

Mediastinum/Nodes: No mass or lymphadenopathy.

Lungs/Pleura: Redemonstration of a 1.8 x 1.6 cm well-circumscribed
nodule in the left upper lobe, stable since 11/22/2019. This is
favored to represent a benign nodule. However, follow-up through
Sunday October, 2021 is recommended as described previously to achieve
24 month follow-up. No pleural effusion. No second pulmonary
parenchymal finding.

Upper Abdomen: Normal

Musculoskeletal: No significant skeletal finding.

Review of the MIP images confirms the above findings.
IMPRESSION: 1. No pulmonary emboli or other acute chest pathology.
2. Redemonstration of a 1.8 x 1.6 cm well-circumscribed nodule in
the left upper lobe, stable since 11/22/2019. This is favored to
represent a benign nodule. Follow-up through Sunday October, 2021
suggested, as described previously, to achieve 24 month follow-up.
3. Aortic atherosclerosis.

Aortic Atherosclerosis ([id number]).

## 2022-09-15 IMAGING — DX DG CHEST 1V PORT
1 series · 1 of 1 positions shown · non-contrast
Comparison: August 28, 2020.

CLINICAL DATA: Shortness of breath.  Chest pressure.

EXAM:
PORTABLE CHEST 1 VIEW

[chest ap]
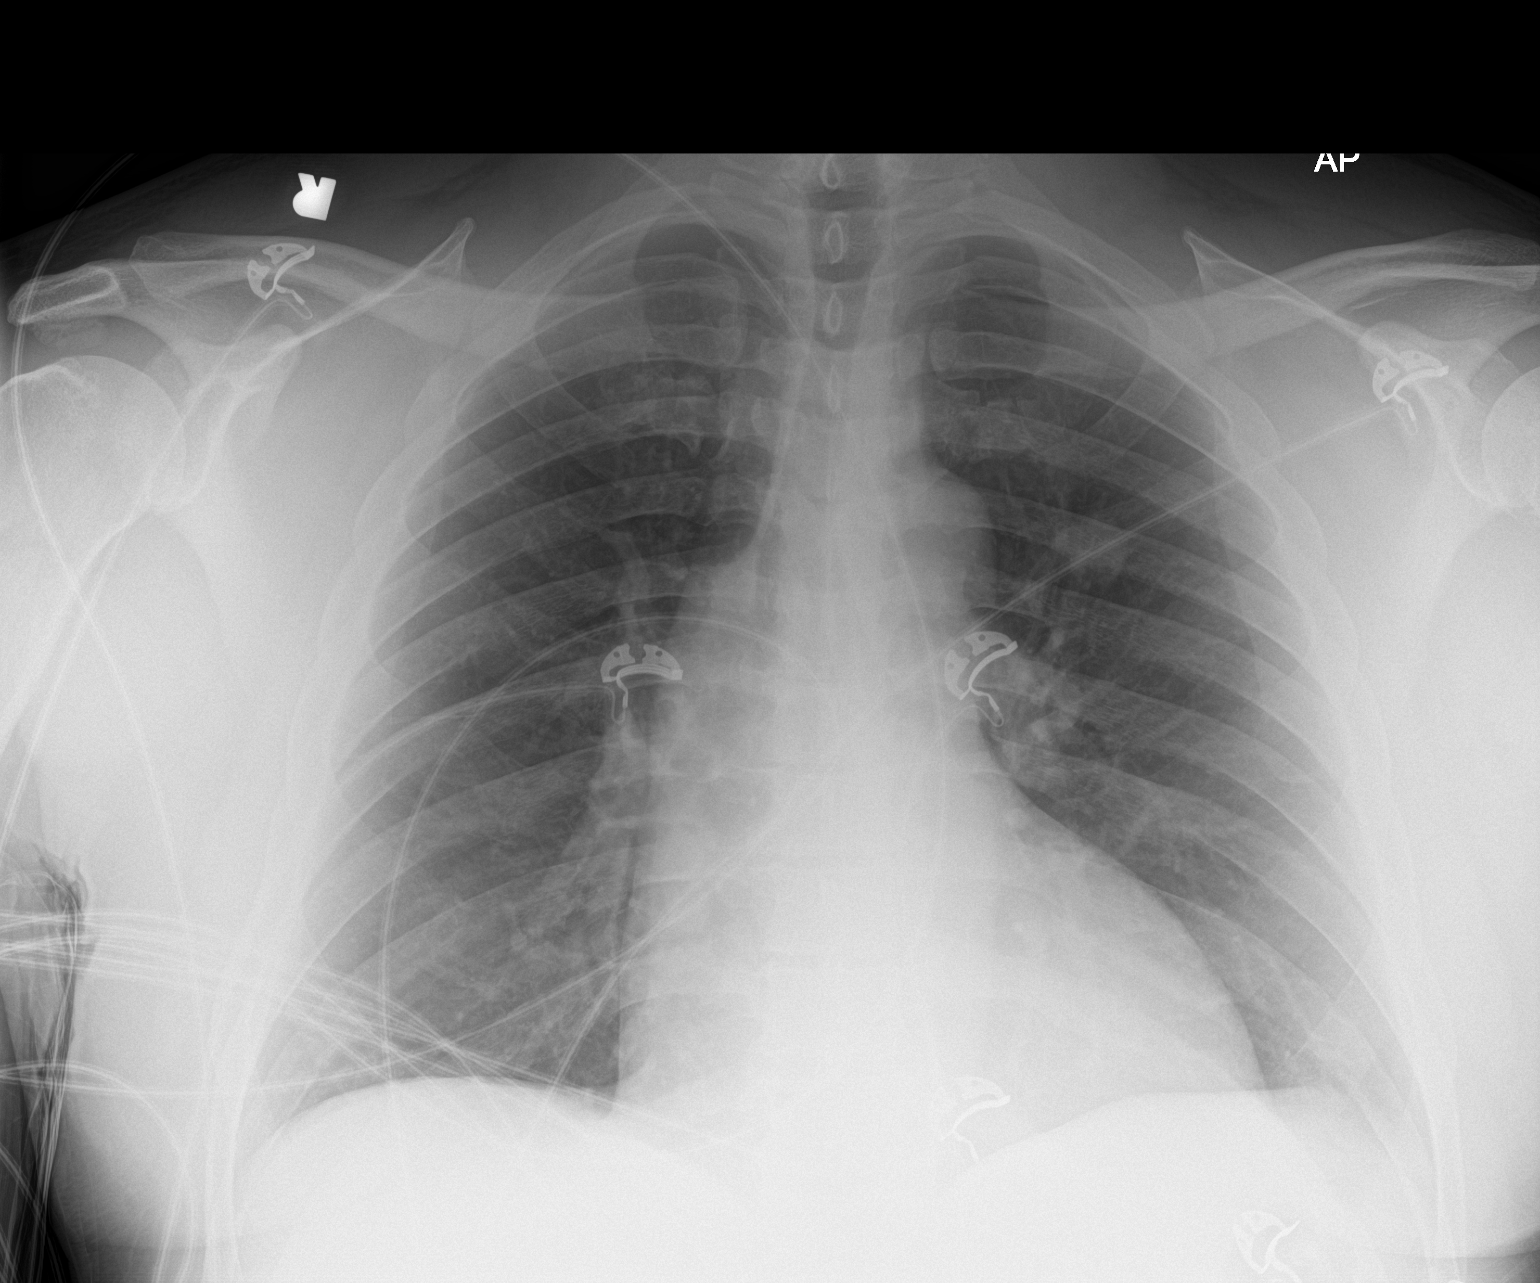

[1 of 1 positions shown; findings below may reference images not displayed]

FINDINGS: Mild enlargement the cardiac silhouette, likely accentuated by AP
portable technique. No consolidation. Similar size of an
approximately 1.6 cm left upper lobe for a nodule, better
characterized on CT chest from October 29, 20. No visible pleural
effusions or pneumothorax. No acute osseous abnormality.
IMPRESSION: 1. No evidence of acute cardiopulmonary disease.
2. Similar size of an approximately 1.6 cm left upper lobe for a
nodule, better characterized on CT chest from October 29, 20.
